# Patient Record
Sex: Male | Born: 1950 | Race: White | Hispanic: No | Marital: Married | Smoking: Former smoker
Health system: Southern US, Community
[De-identification: ages and names within clinical notes are randomized; demographics above are authoritative.]

## PROBLEM LIST (undated history)

## (undated) DIAGNOSIS — I10 Essential (primary) hypertension: Secondary | ICD-10-CM

## (undated) DIAGNOSIS — H9190 Unspecified hearing loss, unspecified ear: Secondary | ICD-10-CM

## (undated) DIAGNOSIS — T148XXA Other injury of unspecified body region, initial encounter: Secondary | ICD-10-CM

## (undated) DIAGNOSIS — M2010 Hallux valgus (acquired), unspecified foot: Secondary | ICD-10-CM

## (undated) DIAGNOSIS — G4730 Sleep apnea, unspecified: Secondary | ICD-10-CM

## (undated) DIAGNOSIS — M5430 Sciatica, unspecified side: Secondary | ICD-10-CM

## (undated) DIAGNOSIS — E039 Hypothyroidism, unspecified: Secondary | ICD-10-CM

## (undated) DIAGNOSIS — K219 Gastro-esophageal reflux disease without esophagitis: Secondary | ICD-10-CM

## (undated) HISTORY — DX: Gastro-esophageal reflux disease without esophagitis: K21.9

## (undated) HISTORY — DX: Other injury of unspecified body region, initial encounter: T14.8XXA

## (undated) HISTORY — PX: NECK SURGERY: SHX720

## (undated) HISTORY — DX: Sleep apnea, unspecified: G47.30

## (undated) HISTORY — PX: KNEE SURGERY: SHX244

## (undated) HISTORY — PX: BACK SURGERY: SHX140

## (undated) HISTORY — DX: Hallux valgus (acquired), unspecified foot: M20.10

## (undated) HISTORY — DX: Unspecified hearing loss, unspecified ear: H91.90

---

## 1999-12-15 ENCOUNTER — Inpatient Hospital Stay (HOSPITAL_COMMUNITY): Admission: EM | Admit: 1999-12-15 | Discharge: 1999-12-17 | Payer: Self-pay | Admitting: Emergency Medicine

## 1999-12-15 ENCOUNTER — Encounter: Payer: Self-pay | Admitting: Emergency Medicine

## 1999-12-16 ENCOUNTER — Encounter: Payer: Self-pay | Admitting: Internal Medicine

## 2000-04-14 ENCOUNTER — Emergency Department (HOSPITAL_COMMUNITY): Admission: EM | Admit: 2000-04-14 | Discharge: 2000-04-14 | Payer: Self-pay | Admitting: Emergency Medicine

## 2001-10-02 ENCOUNTER — Inpatient Hospital Stay (HOSPITAL_COMMUNITY): Admission: EM | Admit: 2001-10-02 | Discharge: 2001-10-04 | Payer: Self-pay | Admitting: Emergency Medicine

## 2002-01-27 ENCOUNTER — Ambulatory Visit (HOSPITAL_COMMUNITY): Admission: RE | Admit: 2002-01-27 | Discharge: 2002-01-27 | Payer: Self-pay | Admitting: Neurosurgery

## 2002-01-27 ENCOUNTER — Encounter: Payer: Self-pay | Admitting: Neurosurgery

## 2002-07-12 ENCOUNTER — Encounter: Payer: Self-pay | Admitting: Emergency Medicine

## 2002-07-12 ENCOUNTER — Emergency Department (HOSPITAL_COMMUNITY): Admission: EM | Admit: 2002-07-12 | Discharge: 2002-07-12 | Payer: Self-pay | Admitting: Emergency Medicine

## 2002-09-28 ENCOUNTER — Encounter: Payer: Self-pay | Admitting: Neurosurgery

## 2002-10-03 ENCOUNTER — Ambulatory Visit (HOSPITAL_COMMUNITY): Admission: RE | Admit: 2002-10-03 | Discharge: 2002-10-03 | Payer: Self-pay | Admitting: Neurosurgery

## 2002-10-03 ENCOUNTER — Encounter: Payer: Self-pay | Admitting: Neurosurgery

## 2003-04-29 ENCOUNTER — Inpatient Hospital Stay (HOSPITAL_COMMUNITY): Admission: AD | Admit: 2003-04-29 | Discharge: 2003-05-01 | Payer: Self-pay | Admitting: Neurosurgery

## 2003-04-29 ENCOUNTER — Encounter: Payer: Self-pay | Admitting: Neurosurgery

## 2003-05-01 ENCOUNTER — Encounter: Payer: Self-pay | Admitting: Neurosurgery

## 2003-10-03 ENCOUNTER — Ambulatory Visit (HOSPITAL_BASED_OUTPATIENT_CLINIC_OR_DEPARTMENT_OTHER): Admission: RE | Admit: 2003-10-03 | Discharge: 2003-10-03 | Payer: Self-pay | Admitting: Family Medicine

## 2004-11-20 ENCOUNTER — Ambulatory Visit: Payer: Self-pay | Admitting: Family Medicine

## 2004-12-28 ENCOUNTER — Ambulatory Visit: Payer: Self-pay | Admitting: Family Medicine

## 2004-12-29 ENCOUNTER — Ambulatory Visit (HOSPITAL_COMMUNITY): Admission: RE | Admit: 2004-12-29 | Discharge: 2004-12-29 | Payer: Self-pay | Admitting: Neurosurgery

## 2005-02-19 ENCOUNTER — Ambulatory Visit (HOSPITAL_COMMUNITY): Admission: RE | Admit: 2005-02-19 | Discharge: 2005-02-20 | Payer: Self-pay | Admitting: Neurosurgery

## 2005-07-13 ENCOUNTER — Ambulatory Visit: Payer: Self-pay | Admitting: Internal Medicine

## 2005-11-22 ENCOUNTER — Ambulatory Visit: Payer: Self-pay | Admitting: Internal Medicine

## 2005-11-30 ENCOUNTER — Ambulatory Visit: Payer: Self-pay | Admitting: Internal Medicine

## 2005-12-07 ENCOUNTER — Ambulatory Visit: Payer: Self-pay | Admitting: Internal Medicine

## 2006-03-21 ENCOUNTER — Ambulatory Visit (HOSPITAL_COMMUNITY): Admission: RE | Admit: 2006-03-21 | Discharge: 2006-03-21 | Payer: Self-pay | Admitting: Neurosurgery

## 2007-01-06 ENCOUNTER — Ambulatory Visit: Payer: Self-pay | Admitting: Internal Medicine

## 2007-01-06 LAB — CONVERTED CEMR LAB
AST: 37 units/L (ref 0–37)
BUN: 14 mg/dL (ref 6–23)
Chloride: 102 meq/L (ref 96–112)
HDL: 44 mg/dL (ref >39)
Hemoglobin: 14 g/dL (ref 13.0–17.0)
PSA: 0.45 ng/mL (ref 0.10–4.00)
Potassium: 3.9 meq/L (ref 3.5–5.3)
Triglycerides: 125 mg/dL (ref <150)
VLDL: 25 mg/dL (ref 0–40)

## 2007-01-12 ENCOUNTER — Encounter: Payer: Self-pay | Admitting: Internal Medicine

## 2007-01-12 LAB — CONVERTED CEMR LAB
FSH: 6.3 milliintl units/mL (ref 1.4–18.1)
LH: 3 milliintl units/mL (ref 1.5–9.3)
Testosterone: 368.19 ng/dL (ref 350–890)

## 2007-02-09 DIAGNOSIS — G4733 Obstructive sleep apnea (adult) (pediatric): Secondary | ICD-10-CM

## 2007-02-09 DIAGNOSIS — I10 Essential (primary) hypertension: Secondary | ICD-10-CM | POA: Insufficient documentation

## 2007-03-24 ENCOUNTER — Encounter: Payer: Self-pay | Admitting: Internal Medicine

## 2007-05-29 ENCOUNTER — Telehealth: Payer: Self-pay | Admitting: Internal Medicine

## 2007-06-20 ENCOUNTER — Telehealth (INDEPENDENT_AMBULATORY_CARE_PROVIDER_SITE_OTHER): Payer: Self-pay | Admitting: *Deleted

## 2007-08-07 ENCOUNTER — Encounter: Payer: Self-pay | Admitting: Internal Medicine

## 2007-08-29 ENCOUNTER — Encounter (INDEPENDENT_AMBULATORY_CARE_PROVIDER_SITE_OTHER): Payer: Self-pay | Admitting: *Deleted

## 2007-08-30 ENCOUNTER — Telehealth (INDEPENDENT_AMBULATORY_CARE_PROVIDER_SITE_OTHER): Payer: Self-pay | Admitting: *Deleted

## 2007-09-05 ENCOUNTER — Telehealth (INDEPENDENT_AMBULATORY_CARE_PROVIDER_SITE_OTHER): Payer: Self-pay | Admitting: *Deleted

## 2007-10-25 ENCOUNTER — Encounter: Payer: Self-pay | Admitting: Internal Medicine

## 2007-11-17 ENCOUNTER — Inpatient Hospital Stay (HOSPITAL_COMMUNITY): Admission: RE | Admit: 2007-11-17 | Discharge: 2007-11-20 | Payer: Self-pay | Admitting: Neurosurgery

## 2007-11-24 ENCOUNTER — Encounter: Payer: Self-pay | Admitting: Internal Medicine

## 2007-11-27 ENCOUNTER — Encounter: Payer: Self-pay | Admitting: Internal Medicine

## 2009-06-26 ENCOUNTER — Ambulatory Visit (HOSPITAL_BASED_OUTPATIENT_CLINIC_OR_DEPARTMENT_OTHER): Admission: RE | Admit: 2009-06-26 | Discharge: 2009-06-26 | Payer: Self-pay | Admitting: Orthopedic Surgery

## 2010-03-17 ENCOUNTER — Emergency Department (HOSPITAL_COMMUNITY): Admission: EM | Admit: 2010-03-17 | Discharge: 2010-03-17 | Payer: Self-pay | Admitting: Family Medicine

## 2010-05-20 ENCOUNTER — Encounter: Admission: RE | Admit: 2010-05-20 | Discharge: 2010-05-20 | Payer: Self-pay | Admitting: Neurosurgery

## 2010-10-04 ENCOUNTER — Encounter: Payer: Self-pay | Admitting: Neurosurgery

## 2010-12-17 LAB — POCT I-STAT, CHEM 8
Calcium, Ion: 1.18 mmol/L (ref 1.12–1.32)
HCT: 41 % (ref 39.0–52.0)
Sodium: 141 mEq/L (ref 135–145)

## 2011-01-26 NOTE — Discharge Summary (Signed)
Jeffrey Fitzpatrick, Jeffrey Fitzpatrick NO.:  [id number]   MEDICAL RECORD NO.:  [id number]          PATIENT TYPE:  INP   LOCATION:  3310                         FACILITY:  [facility]   PHYSICIAN:  Payton Doughty, M.D.      DATE OF BIRTH:  1951-01-08   DATE OF ADMISSION:  11/17/2007  DATE OF DISCHARGE:  11/20/2007                               DISCHARGE SUMMARY   ADMITTING DIAGNOSIS:  Displaced plate at W1-1, B1-4.   DISCHARGE DIAGNOSIS:  Displaced plate at N8-2, N5-6.   OPERATIVE PROCEDURE:  1. Removal of C5-6, C6-7 plate.  2. Evacuation of neck hematoma.   SERVICE:  Neurosurgery.   COMPLICATIONS:  Neck hematoma.   This is a 60 year old right-handed white gentleman whose History and  Physical is recounted on the chart.  He was fused several years ago, has  a solid fusion but has had increasing dysphagia, and his plate is back  and out a bit.  He is admitted for removal.   PAST MEDICAL HISTORY:  Benign.   PHYSICAL EXAMINATION:  GENERAL:  Intact.  NEUROLOGIC:  Intact.   HOSPITAL COURSE:  He was admitted after ascertaining normal laboratory  values.  He underwent removal of his hardware.  Dr. Lazarus Salines assisted.  Postoperatively, he was in the recovery room doing well, and some  swelling in the left side of his neck was noticed.  It was felt that he  was developing a hematoma.  He was taken urgently back to the OR for  evacuation.  It should be noted that in he first operation, a Al Pimple drain was placed.  In the second procedure, there was some clot  found, and a Hemovac drain was placed.  After the second operation, he  has had no further difficulties with accumulation of hematoma.  Intraoperatively at that time, there were found to be small bleeding  points on the longus coli.  Following cessation of drainage, the Hemovac  was removed on November 19, 2007.  The patient was watched in the ICU and  down in the step-down unit.  He was awake and alert, has no evidence of  neck  swelling.  Neurologically, he remains intact. Airway is good.  He  is being discharged home to the care of his family.  His followup will  be in the [facility] offices in about 5 days for suture removal.           ______________________________  Payton Doughty, M.D.     MWR/MEDQ  D:  11/20/2007  T:  11/20/2007  Job:  [id number]

## 2011-01-26 NOTE — H&P (Signed)
NAMESAMAN, UMSTEAD NO.:  [id number]   MEDICAL RECORD NO.:  [id number]          PATIENT TYPE:  INP   LOCATION:  2899                         FACILITY:  [facility]   PHYSICIAN:  Payton Doughty, M.D.      DATE OF BIRTH:  Dec 09, 1950   DATE OF ADMISSION:  11/17/2007  DATE OF DISCHARGE:                              HISTORY & PHYSICAL   ADMISSION DIAGNOSIS:  Back out plate at Z6-1, W9-6.   SERVICE:  Neurosurgery.   HISTORY:  This is a now 60 year old right-handed white gentleman who had  an anterior decompression and fusion done at C5-6 and C6-7 three years  ago.  He has got a good fusion solid but the plate his backed out and is  causing him some dysphagia so he is here for plate removal.   MEDICAL HISTORY:  Is benign.   SURGICAL HISTORY:  Is decompressive laminectomy in the past.   ALLERGIES:  None.   MEDICATIONS:  He is on antihypertensive medication.   FAMILY HISTORY:  Not given.   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   REVIEW OF SYSTEMS:  Remarkable for dysphagia.   PHYSICAL EXAMINATION:  HEENT:  Within normal limits.  He has a well-  healed incision in his neck with good range of motion.  CHEST:  Clear.  CARDIAC:  Regular rate and rhythm.  ABDOMEN:  Nontender with no hepatosplenomegaly.  EXTREMITIES:  Without clubbing or cyanosis.  GU:  Exam is deferred.  EXTREMITIES:  Peripheral pulses are good.  NEUROLOGICAL:  He is awake, alert and oriented.  Cranial nerves are  intact.  Motor exam shows 5/5 strength throughout the upper extremities.  Lower extremity strength is full.  Reflexes are intact.  Hoffman's is  negative.   Films show back out of the plate at E4-5 and C6-7.   CLINICAL IMPRESSION:  Back out of plate at W0-9 and C6-7.   PLAN:  For plate removal.  The risks and benefits have been discussed  with him especially potential injury of the esophagus and he wishes to  proceed.     ______________________________  Payton Doughty, M.D.     MWR/MEDQ  D:  11/17/2007  T:  11/18/2007  Job:  [id number]

## 2011-01-26 NOTE — Op Note (Signed)
[name], Jeffrey Fitzpatrick NO.:  [id number]   MEDICAL RECORD NO.:  [id number]          PATIENT TYPE:  INP   LOCATION:  2899                         FACILITY:  [facility]   PHYSICIAN:  Payton Doughty, M.D.      DATE OF BIRTH:  April 03, 1951   DATE OF PROCEDURE:  11/17/2007  DATE OF DISCHARGE:                               OPERATIVE REPORT   PREOPERATIVE DIAGNOSIS:  Protruding, displaced plate at O9-6, E9-5.   POSTOPERATIVE DIAGNOSIS:  Protruding, displaced plate at M8-4, X3-2.   OPERATIVE PROCEDURE:  Removal of anterior cervical C5-6 and C6-7 plate.   SURGEON:  Payton Doughty, MD.   SERVICE:  Neurosurgery.   ANESTHESIA:  General endotracheal.   PREP:  Sterile Betadine prep and scrub with alcohol wipe.   COMPLICATIONS:  None.   ASSISTANT:  Zola Button T. [facility], MD.   This is a 60 year old gentleman, who had a plate put in three years ago.  It has pushed forward a bit and he is having some dysphagia.  He was  taken to the operating room and smoothly anesthetized and intubated and  placed supine on the operating table in the halter head traction.  Following shave, prep, and drape in the usual sterile fashion, the old  skin incision was reopened, the sternocleidomastoid was identified, and  medial dissection revealed the carotid artery.  At this point, the  scarring was quite dense and so I asked for help from ENT surgeon, Dr.  Flo Shanks, who very graciously came in and assisted with the  dissection, which was carried lateral to the carotid artery and then  medially.  Underneath the esophagus and the scar complex, the plate was  visualized and carefully dissected free.  Four of the screws were  removed; two could not be removed and they were loosened and the plate  lifted free.  The wound was irrigated, Methylene Blue was instilled into  the proximal esophagus, and no leak was noted.  A Jackson-Pratt drain  was placed in the prevertebral space, and successive layers of #3-0  Vicryl and #4-0 Vicryl were used to close.  Benzoin and Steri-Strips  were placed and made occlusive with Telfa and OpSite, and the patient  returned to the recovery room in good condition.           ______________________________  Payton Doughty, M.D.     MWR/MEDQ  D:  11/17/2007  T:  11/17/2007  Job:  [id number]

## 2011-01-26 NOTE — Op Note (Signed)
NAMEEVERT, Jeffrey NO.:  [id number]   MEDICAL RECORD NO.:  [id number]          PATIENT TYPE:  INP   LOCATION:  3172                         FACILITY:  [facility]   PHYSICIAN:  Payton Doughty, M.D.      DATE OF BIRTH:  [birth year]   DATE OF PROCEDURE:  11/17/2007  DATE OF DISCHARGE:                               OPERATIVE REPORT   PREOPERATIVE DIAGNOSIS:  Neck hematoma.   POSTOPERATIVE DIAGNOSIS:  Neck hematoma.   OPERATIVE PROCEDURE:  Re-exploration of neck hematoma.   SURGEON:  Payton Doughty, M.D.   SERVICE:  Neurosurgery.   ANESTHESIA:  General endotracheal.   PREPARATION:  Prepped with sterile Betadine prep and scrubbed with  alcohol wipe.   COMPLICATIONS:  None.   NURSE ASSISTANT:  [place].   BODY OF TEXT:  This is a gentleman who several hours prior had had  removal of an anterior cervical plate, had been doing well and developed  some swelling on the left side of his neck and was taken back emergently  for evacuation of hematoma.  The patient was taken to the operating and  smoothly anesthetized with a Glide scope.  He was placed in the supine  position with the halter head traction and the old incision and  following shave, prepped and draped in the usual sterile fashion.  The  old skin incision was reopened.  Hematoma was evident; it was rapidly  evacuated.  Using the handheld retractors, the cavity was carefully  explored.  The only bleeding points that were found were on the longus  colli muscle; these were coagulated.  It was irrigated until clear and  observed for 15 minutes and no bleeding was evident.  A Hemovac drain  was placed, medium size.  It was connected to the drainage system and  secured.  Successive layers of 3-0 Vicryl and 4-0 nylon were used to  close.  Betadine and Telfa dressing were applied and the patient  returned to the recovery room in good condition.           ______________________________  Payton Doughty, M.D.     MWR/MEDQ  D:  11/17/2007  T:  11/20/2007  Job:  [id number]

## 2011-01-29 NOTE — Consult Note (Signed)
[place]. [facility]  Patient:    Jeffrey Fitzpatrick, Jeffrey Fitzpatrick Visit Number: [id number] MRN: [id number]          Service Type: MED Location: 3000 [id number] Attending Physician:  Emeterio Reeve Dictated by:   Payton Doughty, M.D. Proc. Date: 10/02/01 Admit Date:  10/02/2001                            Consultation Report  ADDENDUM  Mr. Grabel, while in the emergency room, had a syncopal episode and although his EKG was negative, he will be admitted as a precautionary measure for observation, therefore, the consult that I previously dictated should be addended to the admit note.  His orders will be written and he will be admitted to [facility]. Dictated by:   Payton Doughty, M.D. Attending Physician:  Emeterio Reeve DD:  10/02/01 TD:  10/03/01 Job: [id number]

## 2011-01-29 NOTE — Op Note (Signed)
NAME:  Jeffrey Fitzpatrick, Jeffrey Fitzpatrick                          ACCOUNT NO.:  [id number]   MEDICAL RECORD NO.:  [id number]                   PATIENT TYPE:  OIB   LOCATION:  2867                                 FACILITY:  [facility]   PHYSICIAN:  Kathaleen Maser. Pool, M.D.                 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   OPERATION PERFORMED:  Right L4-5 decompressive laminotomy with foraminotomy.   SURGEON:  Kathaleen Maser. Pool, M.D.   ASSISTANT:  Donalee Citrin, M.D.   ANESTHESIA:  General endotracheal.   INDICATIONS FOR PROCEDURE:  The patient is a 60 year old male with history  of back and right lower extremity pain, paresthesias and weakness consistent  with a right-sided L5 radiculopathy.  Work-up has demonstrated severe  multilevel stenosis, worse on the right at L4-5. We discussed options  available for management including the possibility of undergoing a right-  sided L4-5 decompressive laminotomy and foraminotomy for hopeful improvement  of symptoms.  The patient is aware of the risks and benefits and wishes to  proceed.   DESCRIPTION OF PROCEDURE:  The patient was taken to the operating room and  placed on the table in the supine position.  After adequate level of  anesthesia was achieved, the patient was positioned prone onto a Wilson  frame and appropriately padded.  The patient's lumbar region was prepped and  draped sterilely.  A 10 blade was used to make a linear skin incision  overlying the L4-5 interspace.  This was carried down sharply in the  midline.  A subperiosteal dissection was then performed on the right side  exposing the lamina and facet joints of L4 and L5.  Deep self-retaining  retractor was placed.  Intraoperative x-ray was taken, the level was  confirmed.   The laminotomy was then performed using high speed drill and  Kerrison rongeurs to remove the inferior edge of the lamina at L4, the  medial one third of the L4-5 facet joint and the superior remnant of the L5  lamina.  Ligamentum flavum was then elevated and resected in piecemeal  fashion using Kerrison rongeurs.  The underlying thecal sac and exiting L5  nerve root were identified.  A wide foraminotomy was then performed along  the course of the exiting L5 nerve root.  A blunt probe was passed easily  into the thecal sac and nerve roots.  There was no evidence of any further  compression.  There was no evidence of injury to thecal sac or nerve roots.  The microscope was brought into the field and used for the microdissection  of the right-sided L5 nerve root and underlying disk.  The disk was examined  and found not  to be significantly herniated.  It was decided not to do a  diskectomy.  The wound was then irrigated with antibiotic solution.  Gelfoam  was placed topically for hemostasis which was found to be good.  Microscope  and retractor system were removed.  Hemostasis in the muscle was achieved  with electrocautery.  The wound was then  closed in layers with Vicryl sutures.  Steri-Strips and sterile dressing  were applied.  There were no apparent complications.  The patient tolerated  the procedure well and returned to the recovery room postoperatively.                                                Henry A. Pool, M.D.    HAP/MEDQ  D:  10/03/2002  T:  10/03/2002  Job:  [id number]

## 2011-01-29 NOTE — Op Note (Signed)
NAMEELIZA, Jeffrey Fitzpatrick NO.:  [id number]   MEDICAL RECORD NO.:  [id number]          PATIENT TYPE:  OIB   LOCATION:  3017                         FACILITY:  [facility]   PHYSICIAN:  Payton Doughty, M.D.      DATE OF BIRTH:  08-18-1951   DATE OF PROCEDURE:  02/19/2005  DATE OF DISCHARGE:                                 OPERATIVE REPORT   PREOPERATIVE DIAGNOSIS:  Spondylosis at C5-6 and C6-7.   POSTOPERATIVE DIAGNOSES:  1.  Spondylosis at C5-6 and C6-7.  2.  Herniated disc at C6-7.   PROCEDURE:  C6-7, C5-6 anterior cervical decompression and fusion with a  Reflex hybrid plate.   SURGEON:  Payton Doughty, M.D.   SERVICE:  General Surgery.   ANESTHESIA:  General endotracheal.   PREPARATION:  Sterilely prepped and scrubbed with alcohol wipe.   COMPLICATIONS:  None.   BODY OF TEXT:  A 60 year old gentleman with spondylosis and right C6 and C7  radiculopathy.  He is taken to the operative room, smoothly anesthetized,  intubated, and placed supine on the operating table with the halter head  traction.  Following shave, prep, and drape in the usual sterile fashion,  the skin was incised in the midline at the medial border of the  sternocleidomastoid on the left side.  The platysma was identified,  elevated, divided and undermined.  The sternocleidomastoid was identified.  __________ dissection revealed the carotid artery retracted laterally on the  left, trachea and esophagus retracted laterally on the right, exposing the  bones of the anterior cervical spine.  Marker was placed.  Intraoperative x-  ray obtained to confirmed correctness of level.  Having confirmed  correctness of level, diskectomy was carried out at C5-6 and C6-7 under  gross observation.  The operating microscope was then brought in, and we  used microdissection technique to remove the remaining disc, remove the  remaining annulus, divide the posterior longitudinal ligament, and carefully  expose the  neural foramina bilaterally.  At C5-6, there was mostly  degenerative disease, with some osteophytes, particularly off to the right  side, and at C6-7 there was a fairly prominent herniated disc extending into  the right C7 neural foramen.  Complete removal of these effected  decompression of the nerve root.  7 mm bone grafts were fashioned with  patellar allograft and tapped into place at each level, and a 37 mm two-  level plate was placed with 12 mm screws, two in C5, two in C6, and two in  C7.  Intraoperative x-rays showed good placement of plate, screws, and bone  graft.  The wound was irrigated, and  successive layers of 3-0 Vicryl and 4-0 Vicryl were used to close the  incision.  Benzoin and Steri-Strips were placed and made occlusive with  Telfa and Op-Site.  Patient placed in an Aspen collar and returned to the  recovery room in god condition.       MWR/MEDQ  D:  02/19/2005  T:  02/20/2005  Job:  [id number]

## 2011-01-29 NOTE — Discharge Summary (Signed)
[place]. [facility]  Patient:    Jeffrey Fitzpatrick, Jeffrey Fitzpatrick                         MRN: [id number] Adm. Date:  [id number] Disc. Date: 12/17/99 Attending:  Pricilla Riffle Dictator:   Lavella Hammock, P.A. CC:         Dr. Shanon Rosser, [facility]             Dietrich Pates, M.D. [facility]                           Discharge Summary  DATE OF BIRTH:  [birth year]  PROCEDURES: 1. Stress Cardiolite. 2. Cardiac catheterization. 3. Coronary arteriogram. 4. Left ventriculogram.  HOSPITAL COURSE:  Mr. Tuzzolino is a 60 year old male with no known history of coronary artery disease, who came to the emergency room for chest pain on December 15, 1999.  He stated that he had been having chest pain continuously for seven days that waxed and waned but never completely went away.  He stated on the date of admission the pain increased in intensity to a 4/10 and ibuprofen was no help, so he came to the emergency room.  He received IV nitroglycerin and heparin and the pain decreased.  He was admitted to rule out MI and for further evaluation.  His enzymes were negative for MI.  Although a CK was consistently elevated, the MB was within normal limits.  He had a stress Cardiolite on December 16, 1999, where he achieved his target heart rate and stopped secondary to fatigue and mild chest pain.  The Cardiolite showed no marked abnormality, but there was a question of slight focal ischemia and the patient was scheduled for cardiac catheterization to further evaluate this. He had a cardiac catheterization done on December 17, 1999, which showed normal coronary arteries and normal left ventricular function.  He was stable after the catheterization and is to be discharged home on December 17, 1999 p.m. if he is ambulatory without difficulty and his groin is stable after ambulation.  LABORATORY DATA:  Hemoglobin 12.5, hematocrit 36.6, WBC 6.5, platelets 227. Sodium 142, potassium 3.8, chloride 103, CO2 36,  BUN 22, creatinine 1.0, glucose 89.  CONDITION ON DISCHARGE:  Stable.  CONSULTATIONS:  None.  COMPLICATIONS:  None.  DISCHARGE DIAGNOSES: 1. Chest pain, no coronary artery disease by catheterization.  We will add H2    blocker to patient medication and to follow up with primary doctor. 2. History of hypertension. 3. History of occasional tobacco use. 4. Family history of coronary artery disease.  DISCHARGE INSTRUCTIONS:  His activity level is to include no driving or strenuous strenuous activity for two days.  He is to stick to a low fat diet. He is to call the office for bleeding, swelling, or drainage at the catheterization site.  FOLLOW-UP:  He is to follow up with Dr. Shanon Rosser at [facility]. He is to follow up with Dr. Dietrich Pates p.r.n.  DISCHARGE MEDICATIONS: 1. Accupril 20 mg p.o. q.d. 2. HCTZ 25 mg p.o. q.d. 3. Ibuprofen p.r.n. 4. Protonix 40 mg p.o. q.d. DD:  12/17/99 TD:  12/17/99 Job: [id number]

## 2011-01-29 NOTE — Cardiovascular Report (Signed)
[place]. [facility]  Patient:    Jeffrey Fitzpatrick, Jeffrey Fitzpatrick                         MRN: [id number] Proc. Date: 12/17/99 Adm. Date:  [id number] Disc. Date: [id number] Attending:  Dietrich Pates V                        Cardiac Catheterization  REFERRING PHYSICIAN:  Dr. Shanon Rosser.  CARDIOLOGIST:  Dr. Jerral Bonito.  PROCEDURES PERFORMED: 1. Left heart catheterization. 2. Ventriculography.  DIAGNOSES: 1. No evidence of flow-limiting coronary artery disease. 2. Normal left ventricular systolic function.  HISTORY:  [name] is a 60 year old white male referred for diagnostic catheterization to access her coronary anatomy.  The patient had presented with substernal chest pain but had ruled out for myocardial infarction.  DESCRIPTION OF PROCEDURE:  After informed consent was obtained, the patient was brought to the catheterization laboratory.  The right groin was sterilely prepped and draped.  Lidocaine 1% was used to infiltrate the right groin and a 6 French arterial sheath was placed using the modified Seldinger technique. Subsequent 6 [place], JR4 catheters were used to engage the left and right coronary artery.  Selective coronary angiography was obtained in various projections.  After selective coronary angiography a 6 French pigtail catheter was advanced into the left ventricle and appropriate left-sided hemodynamics were obtained.  Subsequently, ventriculography was performed using power injections of contrast.  The pigtail catheter was then pulled back.  At the termination of the case the catheter and sheath were removed, and manual pressure applied until adequate hemostasis achieved.  The patient tolerated the procedure well and was transferred to the floor in stable condition.  FINDINGS:  HEMODYNAMICS:  Left ventricular pressure 150/22 mmHg, aortic pressure 150/88 mmHg.  SELECTIVE CORONARY ANGIOGRAPHY:  The left main coronary artery is a large vessel with no  flow-limiting coronary artery disease.  Left anterior descending artery demonstrates no evidence of flow-limiting coronary artery disease.  The left circumflex coronary artery is a free of flow-limiting disease.  Right coronary artery has no evidence of flow-limiting coronary artery disease.  Coronary circulation is a right dominant.  LEFT VENTRICULOGRAPHY:  Ventriculography was performed in a single plane RAO projection.  Ejection fraction was 60% with no wall motion abnormalities.  CONCLUSIONS: 1. No evidence for flow-limiting coronary artery disease. 2. Normal left ventricular systolic function.  RECOMMENDATIONS:  The patient will likely be discharged to home later today. There is no evidence of flow-limiting coronary artery disease.  Chest pain symptom is more likely to be musculoskeletal in nature.  Further followup will be provided by Dr. Myrtis Ser. DD:  02/19/00 TD:  02/23/00 Job: [id number]
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
## 2011-01-29 NOTE — Op Note (Signed)
NAME:  Jeffrey Fitzpatrick, SEARCY                          ACCOUNT NO.:  [id number]   MEDICAL RECORD NO.:  [id number]                   PATIENT TYPE:  INP   LOCATION:  3039                                 FACILITY:  [facility]   PHYSICIAN:  Kathaleen Maser. Pool, M.D.                 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   OPERATION PERFORMED:  Left L2-3 laminotomy and microdiskectomy.   SURGEON:  Kathaleen Maser. Pool, M.D.   ASSISTANT:  Donalee Citrin, M.D.   ANESTHESIA:  General endotracheal.   INDICATIONS FOR PROCEDURE:  Mr. Whisenant is a 60 year old male with history of  severe back and left lower extremity pain consistent with a left-sided L3  radiculopathy.  The patient has failed conservative management.  MRI  scanning demonstrates evidence of a large left-sided L2-3 disk herniation  with compression of the thecal sac and L3 nerve root.  The patient has  failed conservative management and presents now for surgery.   DESCRIPTION OF PROCEDURE:  The patient was taken to the operating room and  placed on the table in the supine position.  After adequate level of  anesthesia was achieved, the patient was positioned prone onto a Wilson  frame and appropriately padded.  The patient's lumbar region was prepped and  draped sterilely.  A 10 blade was used to make a linear skin incision  overlying the L2-3 interspace.  This was carried down sharply in the  midline.  A subperiosteal dissection was then performed on the left side  exposing the lamina and facet joints of L2 and L3.  Deep self-retaining  retractor was placed.  Intraoperative x-ray was taken, the level was  confirmed.  The laminotomy was then performed using high speed drill and  Kerrison rongeurs to remove the inferior aspect of the lamina at L2 lamina,  the medial aspect of the L2-3 facet joint and the superior rim of the L3  lamina.  The ligamentum flavum was then elevated and resected in piecemeal  fashion using Kerrison rongeurs.  The underlying thecal sac and exiting L3  nerve root were identified.   The microscope was brought into the field and used for microdissection of  the L2-3 disk space and overlying nerve root.  The epidural venous plexus  was coagulated and cut.  The thecal sac and nerve roots were retracted and  held in place at the midline.  Disk herniation was readily apparent.  Disk  dissected free using blunt nerve hooks and pituitary rongeurs.  Large  fragments of acute disk herniation were encountered.  The disk space was  then isolated and incised with a 15 blade in rectangular  fashion.  A wide  disk space cleanout was then achieved pituitary rongeurs and upward angled  pituitary rongeurs and Epstein curets.  All loose or obviously degenerative  disk material was removed from the interspace.  All elements of disk  herniation were completely resected.  The wound was then irrigated with  antibiotic solution.  Gelfoam was placed topically for hemostasis which was  found to be good.  Microscope and retractor system were removed.  Hemostasis  in the muscle was achieved with electrocautery.  The  wound was then closed in layers with Vicryl sutures.  Steri-Strips and  sterile dressing were applied.  The patient tolerated the procedure well  without any complications and returned to the recovery room in good  condition.                                                Henry A. Pool, M.D.    HAP/MEDQ  D:  05/22/2003  T:  05/23/2003  Job:  [id number]

## 2011-01-29 NOTE — H&P (Signed)
Jeffrey Fitzpatrick, HAMAD NO.:  [id number]   MEDICAL RECORD NO.:  [id number]          PATIENT TYPE:  OIB   LOCATION:  3017                         FACILITY:  [facility]   PHYSICIAN:  Payton Doughty, M.D.      DATE OF BIRTH:  [birth year]   DATE OF ADMISSION:  02/19/2005  DATE OF DISCHARGE:                                HISTORY & PHYSICAL   ADMITTING DIAGNOSIS:  Spondylosis C5-C6 and C6-C7.   SERVICE:  Neurosurgery.   __________  A 60 year old, right handed, white gentleman who a number of  years ago I saw for consultation for a C1 fracture.  He did well with that.  I have seen him off and on over the years.  About a week ago, he called with  increasing pain down his neck and down his right arm.  MR showed a disk at  C5-C6.  He was tried in traction and epidural steroids which have not helped  him.  He is now admitted for an anterior decompression and fusion.   MEDICAL HISTORY:  Benign.   SURGICAL HISTORY:  Decompressive laminectomy in the past.   ALLERGIES:  None.   MEDICATIONS:  Antihypertensives.   PHYSICAL EXAMINATION:  HEENT:  Within normal limits.  NECK:  He has reasonable range of motion of his neck.  CHEST:  Clear.  CARDIAC:  Regular rate and rhythm.  ABDOMEN:  Nontender.  No hepatosplenomegaly.  EXTREMITIES:  Without clubbing, cyanosis.  Peripheral pulses are good.  GU:  Deferred.  NEUROLOGIC:  He is awake, alert, and oriented.  His cranial nerves are  intact.  Motor exam shows 5/5 strength throughout the upper extremities,  save for the right triceps which is 5-/5.  He has a C6 and C7 sensory  deficit on the right side.  Reflexes are absent on the right arm, 1 on the  left.  Lower extremities are __________  .   MR results show spondylosis at C5-C6 and C6-C7.   PLAN:  Anterior cervical decompression/fusion C5-C6, C6-C7.  The risks and  benefits of this approach have been discussed with him and he wishes to  proceed.      MWR/MEDQ  D:  02/19/2005  T:   02/19/2005  Job:  [id number]

## 2011-06-04 LAB — DIFFERENTIAL
Basophils Absolute: 0
Basophils Relative: 0
Monocytes Absolute: 0.7
Monocytes Relative: 9
Neutro Abs: 4.8

## 2011-06-04 LAB — CBC
Hemoglobin: 14
MCV: 87.4
RBC: 4.8

## 2011-06-04 LAB — COMPREHENSIVE METABOLIC PANEL
AST: 37
Chloride: 104
GFR calc non Af Amer: >60
Glucose, Bld: 111 — ABNORMAL HIGH
Potassium: 3.8
Total Bilirubin: 0.6
Total Protein: 7.3

## 2011-06-04 LAB — URINALYSIS, ROUTINE W REFLEX MICROSCOPIC
Glucose, UA: NEGATIVE
Hgb urine dipstick: NEGATIVE
Specific Gravity, Urine: 1.019
Urobilinogen, UA: 0.2
pH: 5

## 2011-06-04 LAB — PROTIME-INR
INR: 1
Prothrombin Time: 13.2

## 2011-06-17 ENCOUNTER — Other Ambulatory Visit: Payer: Self-pay | Admitting: Neurosurgery

## 2011-06-17 DIAGNOSIS — M549 Dorsalgia, unspecified: Secondary | ICD-10-CM

## 2011-06-17 DIAGNOSIS — M545 Low back pain: Secondary | ICD-10-CM

## 2011-06-17 DIAGNOSIS — R200 Anesthesia of skin: Secondary | ICD-10-CM

## 2011-06-23 ENCOUNTER — Ambulatory Visit
Admission: RE | Admit: 2011-06-23 | Discharge: 2011-06-23 | Disposition: A | Discharge status: Home / Self Care | Payer: Medicare Other | Source: Ambulatory Visit | Attending: Neurosurgery | Admitting: Neurosurgery

## 2011-06-23 DIAGNOSIS — M545 Low back pain: Secondary | ICD-10-CM

## 2011-06-23 DIAGNOSIS — R200 Anesthesia of skin: Secondary | ICD-10-CM

## 2011-06-23 DIAGNOSIS — M549 Dorsalgia, unspecified: Secondary | ICD-10-CM

## 2011-07-16 ENCOUNTER — Encounter (HOSPITAL_COMMUNITY): Payer: Self-pay | Admitting: Pharmacy Technician

## 2011-07-19 ENCOUNTER — Other Ambulatory Visit: Payer: Self-pay

## 2011-07-19 ENCOUNTER — Other Ambulatory Visit (HOSPITAL_COMMUNITY): Payer: Self-pay | Admitting: Neurosurgery

## 2011-07-19 ENCOUNTER — Ambulatory Visit (HOSPITAL_COMMUNITY): Admission: RE | Admit: 2011-07-19 | Payer: Medicare Other | Source: Ambulatory Visit

## 2011-07-19 ENCOUNTER — Encounter (HOSPITAL_COMMUNITY)
Admission: RE | Admit: 2011-07-19 | Discharge: 2011-07-19 | Disposition: A | Discharge status: Home / Self Care | Payer: Medicare Other | Source: Ambulatory Visit | Attending: Neurosurgery | Admitting: Neurosurgery

## 2011-07-19 ENCOUNTER — Encounter (HOSPITAL_COMMUNITY): Payer: Self-pay

## 2011-07-19 ENCOUNTER — Ambulatory Visit (HOSPITAL_COMMUNITY)
Admission: RE | Admit: 2011-07-19 | Discharge: 2011-07-19 | Disposition: A | Discharge status: Home / Self Care | Payer: Medicare Other | Source: Ambulatory Visit | Attending: Neurosurgery | Admitting: Neurosurgery

## 2011-07-19 DIAGNOSIS — Z01811 Encounter for preprocedural respiratory examination: Secondary | ICD-10-CM

## 2011-07-19 DIAGNOSIS — Z01812 Encounter for preprocedural laboratory examination: Secondary | ICD-10-CM | POA: Insufficient documentation

## 2011-07-19 DIAGNOSIS — Z01818 Encounter for other preprocedural examination: Secondary | ICD-10-CM | POA: Insufficient documentation

## 2011-07-19 DIAGNOSIS — M48061 Spinal stenosis, lumbar region without neurogenic claudication: Secondary | ICD-10-CM | POA: Insufficient documentation

## 2011-07-19 DIAGNOSIS — Z01810 Encounter for preprocedural cardiovascular examination: Secondary | ICD-10-CM | POA: Insufficient documentation

## 2011-07-19 HISTORY — DX: Essential (primary) hypertension: I10

## 2011-07-19 LAB — DIFFERENTIAL
Basophils Relative: 1 % (ref 0–1)
Eosinophils Absolute: 0.1 10*3/uL (ref 0.0–0.7)
Eosinophils Relative: 2 % (ref 0–5)
Lymphs Abs: 2.3 10*3/uL (ref 0.7–4.0)
Monocytes Relative: 9 % (ref 3–12)

## 2011-07-19 LAB — BASIC METABOLIC PANEL
BUN: 13 mg/dL (ref 6–23)
CO2: 29 mEq/L (ref 19–32)
Chloride: 104 mEq/L (ref 96–112)
Glucose, Bld: 90 mg/dL (ref 70–99)
Potassium: 4.2 mEq/L (ref 3.5–5.1)

## 2011-07-19 LAB — CBC
Hemoglobin: 13.6 g/dL (ref 13.0–17.0)
MCH: 28.8 pg (ref 26.0–34.0)
MCHC: 33.8 g/dL (ref 30.0–36.0)
MCV: 85 fL (ref 78.0–100.0)
RBC: 4.73 MIL/uL (ref 4.22–5.81)

## 2011-07-19 LAB — ABO/RH: ABO/RH(D): B POS

## 2011-07-19 NOTE — Pre-Procedure Instructions (Signed)
20 Jeffrey Fitzpatrick  07/19/2011   Your procedure is scheduled on:  07/27/11  Report to [facility] at 530 AM.  Call this number if you have problems the morning of surgery: [phone]   Remember:   Do not eat food:After Midnight.  Do not drink clear liquids: 4 Hours before arrival.  Take these medicines the morning of surgery with A SIP OF WATER: NORVAC,SYNTHROID,DIOVAN   Do not wear jewelry, make-up or nail polish.  Do not wear lotions, powders, or perfumes. You may wear deodorant.  Do not shave 48 hours prior to surgery.  Do not bring valuables to the hospital.  Contacts, dentures or bridgework may not be worn into surgery.  Leave suitcase in the car. After surgery it may be brought to your room.  For patients admitted to the hospital, checkout time is 11:00 AM the day of discharge.   Patients discharged the day of surgery will not be allowed to drive home.  Name and phone number of your driver: FAMILY  Special Instructions: CHG Shower Use Special Wash: 1/2 bottle night before surgery and 1/2 bottle morning of surgery.   Please read over the following fact sheets that you were given: MRSA Information and Surgical Site Infection Prevention

## 2011-07-23 NOTE — Consult Note (Signed)
Anesthesia:  Patient is a 60 year old for lumbar decompression/lami on 07/27/11.  Hx of HTN, former smoker.  Asked to review abnormal EKG showing inferolateral T wave inversion.  He had a similar pre-operative EKG on 11/10/07 with interpreting Cardiologist feeling that it had not significantly changed since 05/01/03.  In 2001 he did have an abnormal stress test followed by a cardiac cath (by Dr. Dietrich Pates) on 12/17/99 showing no evidence of flow limiting CAD and normal LV systolic function.  I reviewed his recent EKG with Dr. Ivin Booty who agrees that since his EKG is stable, would anticipate that he can proceed if remains asymptomatic.

## 2011-07-26 MED ORDER — DEXAMETHASONE SODIUM PHOSPHATE 10 MG/ML IJ SOLN
10.0000 mg | Freq: Once | INTRAMUSCULAR | Status: AC
  Administered 2011-07-27: 10 mg via INTRAVENOUS

## 2011-07-26 MED ORDER — CEFAZOLIN SODIUM 1-5 GM-% IV SOLN
1.0000 g | INTRAVENOUS | Status: DC
  Filled 2011-07-26: qty 50

## 2011-07-27 ENCOUNTER — Encounter (HOSPITAL_COMMUNITY): Payer: Self-pay | Admitting: *Deleted

## 2011-07-27 ENCOUNTER — Encounter (HOSPITAL_COMMUNITY)
Admission: RE | Disposition: A | Discharge status: Home / Self Care | Payer: Self-pay | Source: Ambulatory Visit | Attending: Neurosurgery

## 2011-07-27 ENCOUNTER — Inpatient Hospital Stay (HOSPITAL_COMMUNITY): Payer: Medicare Other

## 2011-07-27 ENCOUNTER — Inpatient Hospital Stay (HOSPITAL_COMMUNITY): Payer: Medicare Other | Admitting: Vascular Surgery

## 2011-07-27 ENCOUNTER — Encounter (HOSPITAL_COMMUNITY): Payer: Self-pay | Admitting: Vascular Surgery

## 2011-07-27 ENCOUNTER — Ambulatory Visit (HOSPITAL_COMMUNITY)
Admission: RE | Admit: 2011-07-27 | Discharge: 2011-07-27 | Disposition: A | Discharge status: Home / Self Care | Payer: Medicare Other | Source: Ambulatory Visit | Attending: Neurosurgery | Admitting: Neurosurgery

## 2011-07-27 DIAGNOSIS — M48062 Spinal stenosis, lumbar region with neurogenic claudication: Principal | ICD-10-CM | POA: Insufficient documentation

## 2011-07-27 DIAGNOSIS — I10 Essential (primary) hypertension: Secondary | ICD-10-CM | POA: Insufficient documentation

## 2011-07-27 DIAGNOSIS — M48061 Spinal stenosis, lumbar region without neurogenic claudication: Secondary | ICD-10-CM | POA: Diagnosis present

## 2011-07-27 DIAGNOSIS — G4730 Sleep apnea, unspecified: Secondary | ICD-10-CM | POA: Insufficient documentation

## 2011-07-27 HISTORY — PX: LUMBAR LAMINECTOMY/DECOMPRESSION MICRODISCECTOMY: SHX5026

## 2011-07-27 SURGERY — LUMBAR LAMINECTOMY/DECOMPRESSION MICRODISCECTOMY
Anesthesia: General | Wound class: Clean

## 2011-07-27 MED ORDER — ONDANSETRON HCL 4 MG/2ML IJ SOLN
INTRAMUSCULAR | Status: DC | PRN
  Administered 2011-07-27: 4 mg via INTRAVENOUS

## 2011-07-27 MED ORDER — DOCUSATE SODIUM 100 MG PO CAPS: 100.0000 mg | ORAL_CAPSULE | Freq: Two times a day (BID) | ORAL | Status: DC

## 2011-07-27 MED ORDER — LACTATED RINGERS IV SOLN
INTRAVENOUS | Status: DC | PRN
  Administered 2011-07-27 (×3): via INTRAVENOUS

## 2011-07-27 MED ORDER — ROCURONIUM BROMIDE 100 MG/10ML IV SOLN
INTRAVENOUS | Status: DC | PRN
  Administered 2011-07-27: 50 mg via INTRAVENOUS
  Administered 2011-07-27: 30 mg via INTRAVENOUS
  Administered 2011-07-27: 10 mg via INTRAVENOUS

## 2011-07-27 MED ORDER — CYCLOBENZAPRINE HCL 10 MG PO TABS: 10.0000 mg | ORAL_TABLET | Freq: Three times a day (TID) | ORAL | Status: AC | PRN

## 2011-07-27 MED ORDER — HYDROCODONE-ACETAMINOPHEN 5-325 MG PO TABS
1.0000 | ORAL_TABLET | ORAL | Status: DC | PRN
  Administered 2011-07-27: 2 via ORAL
  Filled 2011-07-27: qty 2

## 2011-07-27 MED ORDER — SODIUM CHLORIDE 0.9 % IV SOLN
250.0000 mL | INTRAVENOUS | Status: DC
  Administered 2011-07-27: 250 mL via INTRAVENOUS

## 2011-07-27 MED ORDER — HYDROMORPHONE HCL PF 1 MG/ML IJ SOLN
INTRAMUSCULAR | Status: AC
  Filled 2011-07-27: qty 1

## 2011-07-27 MED ORDER — ASPIRIN EC 81 MG PO TBEC
81.0000 mg | DELAYED_RELEASE_TABLET | Freq: Every day | ORAL | Status: DC
  Administered 2011-07-27: 81 mg via ORAL
  Filled 2011-07-27: qty 1

## 2011-07-27 MED ORDER — CEFAZOLIN SODIUM 1-5 GM-% IV SOLN
INTRAVENOUS | Status: DC | PRN
  Administered 2011-07-27: 1 g via INTRAVENOUS

## 2011-07-27 MED ORDER — OLMESARTAN 10 MG HALF TABLET
10.0000 mg | ORAL_TABLET | Freq: Every day | ORAL | Status: DC
  Filled 2011-07-27: qty 1

## 2011-07-27 MED ORDER — NEOSTIGMINE METHYLSULFATE 1 MG/ML IJ SOLN
INTRAMUSCULAR | Status: DC | PRN
  Administered 2011-07-27: 5 mg via INTRAVENOUS

## 2011-07-27 MED ORDER — HYDROMORPHONE HCL PF 1 MG/ML IJ SOLN: 0.2500 mg | INTRAMUSCULAR | Status: DC | PRN

## 2011-07-27 MED ORDER — LEVOTHYROXINE SODIUM 100 MCG PO TABS
100.0000 ug | ORAL_TABLET | Freq: Every day | ORAL | Status: DC
  Filled 2011-07-27: qty 1

## 2011-07-27 MED ORDER — MENTHOL 3 MG MT LOZG: 1.0000 | LOZENGE | OROMUCOSAL | Status: DC | PRN

## 2011-07-27 MED ORDER — BUPIVACAINE HCL (PF) 0.25 % IJ SOLN
INTRAMUSCULAR | Status: DC | PRN
  Administered 2011-07-27: 20 mL

## 2011-07-27 MED ORDER — CYCLOBENZAPRINE HCL 10 MG PO TABS: 10.0000 mg | ORAL_TABLET | Freq: Three times a day (TID) | ORAL | Status: DC | PRN

## 2011-07-27 MED ORDER — SODIUM CHLORIDE 0.9 % IJ SOLN
3.0000 mL | Freq: Two times a day (BID) | INTRAMUSCULAR | Status: DC
  Administered 2011-07-27: 3 mL via INTRAVENOUS

## 2011-07-27 MED ORDER — SODIUM CHLORIDE 0.9 % IJ SOLN: 3.0000 mL | INTRAMUSCULAR | Status: DC | PRN

## 2011-07-27 MED ORDER — GLYCOPYRROLATE 0.2 MG/ML IJ SOLN
INTRAMUSCULAR | Status: DC | PRN
  Administered 2011-07-27: .8 mg via INTRAVENOUS
  Administered 2011-07-27: 0.2 mg via INTRAVENOUS

## 2011-07-27 MED ORDER — HYDROCODONE-ACETAMINOPHEN 5-325 MG PO TABS: 1.0000 | ORAL_TABLET | ORAL | Status: AC | PRN

## 2011-07-27 MED ORDER — THROMBIN 5000 UNITS EX KIT
PACK | CUTANEOUS | Status: DC | PRN
  Administered 2011-07-27: 5000 [IU] via TOPICAL

## 2011-07-27 MED ORDER — THERA M PLUS PO TABS
1.0000 | ORAL_TABLET | Freq: Every day | ORAL | Status: DC
  Filled 2011-07-27: qty 1

## 2011-07-27 MED ORDER — ONDANSETRON HCL 4 MG/2ML IJ SOLN: 4.0000 mg | Freq: Once | INTRAMUSCULAR | Status: DC | PRN

## 2011-07-27 MED ORDER — HYDROMORPHONE HCL PF 1 MG/ML IJ SOLN
0.5000 mg | INTRAMUSCULAR | Status: DC | PRN
  Administered 2011-07-27: 13:00:00 via INTRAVENOUS

## 2011-07-27 MED ORDER — SUFENTANIL CITRATE 50 MCG/ML IV SOLN
INTRAVENOUS | Status: DC | PRN
  Administered 2011-07-27: 10 ug via INTRAVENOUS
  Administered 2011-07-27: 20 ug via INTRAVENOUS

## 2011-07-27 MED ORDER — MUPIROCIN 2 % EX OINT
TOPICAL_OINTMENT | Freq: Once | CUTANEOUS | Status: DC
  Filled 2011-07-27: qty 22

## 2011-07-27 MED ORDER — ACETAMINOPHEN 325 MG PO TABS: 650.0000 mg | ORAL_TABLET | ORAL | Status: DC | PRN

## 2011-07-27 MED ORDER — PROPOFOL 10 MG/ML IV EMUL
INTRAVENOUS | Status: DC | PRN
  Administered 2011-07-27: 300 mg via INTRAVENOUS

## 2011-07-27 MED ORDER — PHENOL 1.4 % MT LIQD: 1.0000 | OROMUCOSAL | Status: DC | PRN

## 2011-07-27 MED ORDER — SODIUM CHLORIDE 0.9 % IR SOLN
Status: DC | PRN
  Administered 2011-07-27: 08:00:00

## 2011-07-27 MED ORDER — ACETAMINOPHEN 650 MG RE SUPP: 650.0000 mg | RECTAL | Status: DC | PRN

## 2011-07-27 MED ORDER — ZOLPIDEM TARTRATE 5 MG PO TABS: 10.0000 mg | ORAL_TABLET | Freq: Every evening | ORAL | Status: DC | PRN

## 2011-07-27 MED ORDER — OXYCODONE-ACETAMINOPHEN 5-325 MG PO TABS: 1.0000 | ORAL_TABLET | ORAL | Status: DC | PRN

## 2011-07-27 MED ORDER — ONDANSETRON HCL 4 MG/2ML IJ SOLN: 4.0000 mg | INTRAMUSCULAR | Status: DC | PRN

## 2011-07-27 MED ORDER — AMLODIPINE BESYLATE 5 MG PO TABS
5.0000 mg | ORAL_TABLET | Freq: Every day | ORAL | Status: DC
  Filled 2011-07-27: qty 1

## 2011-07-27 MED ORDER — SODIUM CHLORIDE 0.9 % IR SOLN
Status: DC | PRN
  Administered 2011-07-27: 1

## 2011-07-27 MED ORDER — EPHEDRINE SULFATE 50 MG/ML IJ SOLN
INTRAMUSCULAR | Status: DC | PRN
  Administered 2011-07-27 (×3): 10 mg via INTRAVENOUS

## 2011-07-27 MED ORDER — ONE-DAILY MULTI VITAMINS PO TABS: 1.0000 | ORAL_TABLET | Freq: Every day | ORAL | Status: DC

## 2011-07-27 MED ORDER — LACTATED RINGERS IV SOLN
INTRAVENOUS | Status: DC
  Administered 2011-07-27: 13:00:00 via INTRAVENOUS

## 2011-07-27 MED ORDER — CEFAZOLIN SODIUM 1-5 GM-% IV SOLN
1.0000 g | Freq: Three times a day (TID) | INTRAVENOUS | Status: DC
  Administered 2011-07-27: 1 g via INTRAVENOUS
  Filled 2011-07-27 (×2): qty 50

## 2011-07-27 SURGICAL SUPPLY — 51 items
ADH SKN CLS APL DERMABOND .7 (GAUZE/BANDAGES/DRESSINGS) ×1
APL SKNCLS STERI-STRIP NONHPOA (GAUZE/BANDAGES/DRESSINGS)
BAG DECANTER FOR FLEXI CONT (MISCELLANEOUS) ×2 IMPLANT
BENZOIN TINCTURE PRP APPL 2/3 (GAUZE/BANDAGES/DRESSINGS) ×1 IMPLANT
BLADE SURG ROTATE 9660 (MISCELLANEOUS) IMPLANT
BRUSH SCRUB EZ PLAIN DRY (MISCELLANEOUS) ×2 IMPLANT
BUR CUTTER 7.0 ROUND (BURR) ×2 IMPLANT
CANISTER SUCTION 2500CC (MISCELLANEOUS) ×2 IMPLANT
CLOTH BEACON ORANGE TIMEOUT ST (SAFETY) ×2 IMPLANT
CONT SPEC 4OZ CLIKSEAL STRL BL (MISCELLANEOUS) ×2 IMPLANT
DECANTER SPIKE VIAL GLASS SM (MISCELLANEOUS) ×2 IMPLANT
DERMABOND ADVANCED (GAUZE/BANDAGES/DRESSINGS) ×1
DERMABOND ADVANCED .7 DNX12 (GAUZE/BANDAGES/DRESSINGS) ×1 IMPLANT
DRAPE INCISE 23X17 IOBAN STRL (DRAPES) ×1
DRAPE INCISE 23X17 STRL (DRAPES) IMPLANT
DRAPE INCISE IOBAN 23X17 STRL (DRAPES) ×1 IMPLANT
DRAPE LAPAROTOMY 100X72X124 (DRAPES) ×2 IMPLANT
DRAPE MICROSCOPE ZEISS OPMI (DRAPES) ×2 IMPLANT
DRAPE POUCH INSTRU U-SHP 10X18 (DRAPES) ×2 IMPLANT
DRAPE PROXIMA HALF (DRAPES) IMPLANT
DRAPE SURG 17X23 STRL (DRAPES) ×4 IMPLANT
ELECT REM PT RETURN 9FT ADLT (ELECTROSURGICAL) ×2
ELECTRODE REM PT RTRN 9FT ADLT (ELECTROSURGICAL) ×1 IMPLANT
EVACUATOR 1/8 PVC DRAIN (DRAIN) ×1 IMPLANT
GAUZE SPONGE 4X4 16PLY XRAY LF (GAUZE/BANDAGES/DRESSINGS) IMPLANT
GLOVE ECLIPSE 8.5 STRL (GLOVE) ×2 IMPLANT
GLOVE EXAM NITRILE LRG STRL (GLOVE) IMPLANT
GLOVE EXAM NITRILE MD LF STRL (GLOVE) ×1 IMPLANT
GLOVE EXAM NITRILE XL STR (GLOVE) IMPLANT
GLOVE EXAM NITRILE XS STR PU (GLOVE) IMPLANT
GOWN BRE IMP SLV AUR LG STRL (GOWN DISPOSABLE) IMPLANT
GOWN BRE IMP SLV AUR XL STRL (GOWN DISPOSABLE) ×2 IMPLANT
GOWN STRL REIN 2XL LVL4 (GOWN DISPOSABLE) IMPLANT
KIT BASIN OR (CUSTOM PROCEDURE TRAY) ×2 IMPLANT
KIT ROOM TURNOVER OR (KITS) ×2 IMPLANT
NDL SPNL 22GX3.5 QUINCKE BK (NEEDLE) ×1 IMPLANT
NEEDLE HYPO 22GX1.5 SAFETY (NEEDLE) ×2 IMPLANT
NEEDLE SPNL 22GX3.5 QUINCKE BK (NEEDLE) ×2 IMPLANT
NS IRRIG 1000ML POUR BTL (IV SOLUTION) ×2 IMPLANT
PACK LAMINECTOMY NEURO (CUSTOM PROCEDURE TRAY) ×2 IMPLANT
RUBBERBAND STERILE (MISCELLANEOUS) ×4 IMPLANT
SPONGE GAUZE 4X4 12PLY (GAUZE/BANDAGES/DRESSINGS) ×2 IMPLANT
SPONGE SURGIFOAM ABS GEL SZ50 (HEMOSTASIS) ×2 IMPLANT
STRIP CLOSURE SKIN 1/2X4 (GAUZE/BANDAGES/DRESSINGS) ×2 IMPLANT
SUT VIC AB 2-0 CT1 18 (SUTURE) ×3 IMPLANT
SUT VIC AB 3-0 SH 8-18 (SUTURE) ×3 IMPLANT
SYR 20ML ECCENTRIC (SYRINGE) ×2 IMPLANT
TAPE CLOTH SURG 4X10 WHT LF (GAUZE/BANDAGES/DRESSINGS) ×1 IMPLANT
TOWEL OR 17X24 6PK STRL BLUE (TOWEL DISPOSABLE) ×2 IMPLANT
TOWEL OR 17X26 10 PK STRL BLUE (TOWEL DISPOSABLE) ×2 IMPLANT
WATER STERILE IRR 1000ML POUR (IV SOLUTION) ×2 IMPLANT

## 2011-07-27 NOTE — Progress Notes (Signed)
Dr Katrinka Blazing aware of sbp180's. Ok to tx to floor. No new orders.

## 2011-07-27 NOTE — Op Note (Signed)
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  Surgeon:Collen Vincent A.Cathlyn Tersigni, M.D.  Asst. Surgeon: Marikay Alar  Anesthesia: General  Indication: The patient is a 60 year old male with history of lower extremity pain paresthesias and numbness consistent with neurogenic claudication which has failed conservative management her workup demonstrates evidence of marked stenosis at L2-3 and L3-4.  Operative note: The patient is taken to the operating room and placed under general anesthesia. The patient is turned prone onto a Wilson frame and appropriately padded. Patient's lumbar region is prepped and draped sterilely. 10 blade is used to make a linear skin incision overlying the L2-3 to 4 levels. This is carried down sharply to the in the midline. A subperiosteal dissection performed exposing the lamina and facet joints of L2-L3 and L4. Deep self retaining retractor was placed intraoperative x-rays taken and the levels were confirmed. Decompressive laminectomy was then performed using [place] rongeurs care center in the high-speed drill to remove the entire lamina of L2 and lamina of L3 and the superior aspect lamina of L4. Ligamentum flavum was elevated and resect usual fashion using Kerrison rongeurs the lateral gutters were undercut using Kerrison rongeurs. Decompressive foraminotomies and then perform on a course exiting L2 and L3 and L4 nerve roots bilaterally. At this point a very thorough decompression achieved. There was no injury to the thecal sac or nerve roots. Wound was then irrigated out solution. Gelfoam was placed over hemostasis activated. A medium Hemovac drain was left upper space. The wounds and closed in layers of Vicryl sutures. Steri-Strips and  sterile dressing were applied. DuraPrep condition. Patient tolerated the procedure well and returned to the recovery room postop.

## 2011-07-27 NOTE — Discharge Summary (Signed)
Physician Discharge Summary  Patient ID: TREGAN READ MRN: [id number] DOB/AGE: [birth year] 60 y.o.  Admit date: 07/27/2011 Discharge date: 07/27/2011  Admission Diagnoses:  Discharge Diagnoses:  Active Problems:  * No active hospital problems. *    Discharged Condition: good  Hospital Course: Patient was admitted the hospital underwent an uncomplicated lumbar decompression postoperatively is doing well. No back or or lower chimney pain. Strength station intact. Wound healing well. Plan discharge home  Consults: none  Significant Diagnostic Studies: None  Treatments: surgery: L2-3 L3-4 decompressive laminectomy  Discharge Exam: Blood pressure 137/77, pulse 78, temperature 97.5 F (36.4 C), temperature source Oral, resp. rate 20, SpO2 91.00%. Awake alert oriented and appropriate cranial nerve function intact motor and sensory function extremities normal wound healing well.  Disposition:   Discharge Orders    Future Orders Please Complete By Expires   Discontinue JP/Blake drain        Current Discharge Medication List    START taking these medications   Details  cyclobenzaprine (FLEXERIL) 10 MG tablet Take 1 tablet (10 mg total) by mouth 3 (three) times daily as needed for muscle spasms. Qty: 30 tablet, Refills: 1    HYDROcodone-acetaminophen (NORCO) 5-325 MG per tablet Take 1-2 tablets by mouth every 4 (four) hours as needed. Qty: 60 tablet, Refills: 1      CONTINUE these medications which have NOT CHANGED   Details  amLODipine (NORVASC) 5 MG tablet Take 5 mg by mouth daily.      aspirin EC 81 MG tablet Take 81 mg by mouth daily.      levothyroxine (SYNTHROID, LEVOTHROID) 100 MCG tablet Take 100 mcg by mouth daily.      Multiple Vitamin (MULTIVITAMIN) tablet Take 1 tablet by mouth daily.      valsartan (DIOVAN) 80 MG tablet Take 80 mg by mouth daily.         Follow-up Information    Make an appointment with Tillman Kazmierski A.   Contact information:   [address] [place] [address] [phone]          Signed: Temple Pacini 07/27/2011, 5:33 PM

## 2011-07-27 NOTE — Anesthesia Procedure Notes (Signed)
Procedure Name: Intubation Date/Time: 07/27/2011 7:49 AM Performed by: Darcey Nora Pre-anesthesia Checklist: Patient identified, Emergency Drugs available, Suction available and Patient being monitored Patient Re-evaluated:Patient Re-evaluated prior to inductionOxygen Delivery Method: Circle System Utilized Preoxygenation: Pre-oxygenation with 100% oxygen Intubation Type: IV induction Ventilation: Mask ventilation without difficulty Laryngoscope Size: 4 and Mac Grade View: Grade II Tube type: Oral Number of attempts: 2 Airway Equipment and Method: bougie stylet and stylet Placement Confirmation: ETT inserted through vocal cords under direct vision,  positive ETCO2,  breath sounds checked- equal and bilateral and CO2 detector Secured at: 23 cm Tube secured with: Tape Dental Injury: Teeth and Oropharynx as per pre-operative assessment  Comments: Cords very tiny, not fully open.

## 2011-07-27 NOTE — Preoperative (Signed)
Beta Blockers   Reason not to administer Beta Blockers:Not Applicable 

## 2011-07-27 NOTE — H&P (Signed)
Jeffrey Fitzpatrick is an 60 y.o. male.   Chief Complaint: Bilateral leg pain. HPI: Patient is a 60 year old male status post 2 previous lumbar surgeries. Patient presents now with bilateral origin the pain paresthesias and weakness consistent with neurogenic claudication failing conservative management. 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 Patient presents now for L2-3 and L3-4 decompressive surgery.  Past Medical History  Diagnosis Date  . Hypertension     STRESS TEST OVER 4 YRS. NO CARDIAC MD    Past Surgical History  Procedure Date  . Back surgery     2 LUMBAR SURGERIERS  . Neck surgery     X 2    No family history on file. Social History:  reports that he quit smoking 8 days ago. His smoking use included Cigarettes. He does not have any smokeless tobacco history on file. He reports that he does not drink alcohol or use illicit drugs.  Allergies:  Allergies  Allergen Reactions  . Adhesive (Tape) Other (See Comments)    States he gets blisters from adhesive tape  . Betadine (Povidone Iodine) Other (See Comments)    Patient told me when I called about PCR swab that he has blisters wit Betadine use    Medications Prior to Admission  Medication Dose Route Frequency Provider Last Rate Last Dose  . ceFAZolin (ANCEF) IVPB 1 g/50 mL premix  1 g Intravenous 60 min Pre-Op Dejohn Ibarra A Dacoda Finlay      . dexamethasone (DECADRON) injection 10 mg  10 mg Intravenous Once [employer]       No current outpatient prescriptions on file as of .    No results found for this or any previous visit (from the past 48 hour(s)). No results found.  Review of Systems  All other systems reviewed and are negative.    There were no vitals taken for this visit. Physical Exam  Constitutional: He is oriented to person, place, and time. He appears well-developed and well-nourished.  HENT:  Head:  Normocephalic and atraumatic.  Eyes: Conjunctivae and EOM are normal. Pupils are equal, round, and reactive to light.  Neck: Normal range of motion. Neck supple. No tracheal deviation present. No thyromegaly present.  Cardiovascular: Normal rate, regular rhythm, normal heart sounds and intact distal pulses.   Respiratory: Effort normal and breath sounds normal. No respiratory distress. Tenderness: status post 2 previous lumbar surgeries. 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  GI: Soft. Bowel sounds are normal. He exhibits no distension. There is no tenderness.  Neurological: He is alert and oriented to person, place, and time. He has normal reflexes. He displays normal reflexes. No cranial nerve deficit. He exhibits normal muscle tone. Coordination normal.  Skin: Skin is warm and dry.  Psychiatric: He has a normal mood and affect. His behavior is normal. Judgment and thought content normal.   motor examination is normal bilaterally sensory examination is intact bilaterally deep tendon versus are hypoactive but symmetric. Gait and posture normal. 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 Plan L2-3 L3-4 decompressive laminectomy with bilateral L2 L3-L4 decompressive foraminotomies. Risks and benefits explained patient wishes to proceed.  [name] A 07/27/2011, 1:03 AM

## 2011-07-27 NOTE — Transfer of Care (Signed)
Immediate Anesthesia Transfer of Care Note  Patient: Jeffrey Fitzpatrick  Procedure(s) Performed:  LUMBAR LAMINECTOMY/DECOMPRESSION MICRODISCECTOMY - Lumbar Two-Three, Lumbar Three-Four Decompressive Lumbar Laminectomy   Patient Location: PACU  Anesthesia Type: General  Level of Consciousness: sedated, patient cooperative and responds to stimulation  Airway & Oxygen Therapy: Patient Spontanous Breathing and Patient connected to nasal cannula oxygen  Post-op Assessment: Report given to PACU RN, Post -op Vital signs reviewed and stable and Patient moving all extremities  Post vital signs: Reviewed and stable  Complications: No apparent anesthesia complications

## 2011-07-27 NOTE — Addendum Note (Signed)
Addendum  created 07/27/11 1237 by Darcey Nora   Modules edited:Anesthesia Events, Anesthesia Flowsheet

## 2011-07-27 NOTE — Addendum Note (Signed)
Addendum  created 07/27/11 1232 by Darcey Nora   Modules edited:Anesthesia Events, Anesthesia Flowsheet

## 2011-07-27 NOTE — Anesthesia Postprocedure Evaluation (Signed)
  Anesthesia Post-op Note  Patient: Jeffrey Fitzpatrick  Procedure(s) Performed:  LUMBAR LAMINECTOMY/DECOMPRESSION MICRODISCECTOMY - Lumbar Two-Three, Lumbar Three-Four Decompressive Lumbar Laminectomy   Patient Location: PACU  Anesthesia Type: General  Level of Consciousness: alert , oriented, sedated and patient cooperative  Airway and Oxygen Therapy: Patient Spontanous Breathing and Patient connected to nasal cannula oxygen  Post-op Pain: mild  Post-op Assessment: Post-op Vital signs reviewed, Patient's Cardiovascular Status Stable, Respiratory Function Stable, Patent Airway and No signs of Nausea or vomiting  Post-op Vital Signs: stable  Complications: No apparent anesthesia complications

## 2011-07-27 NOTE — Anesthesia Preprocedure Evaluation (Signed)
Anesthesia Evaluation  Patient identified by MRN, date of birth, ID band Patient awake    Reviewed: Allergy & Precautions, H&P , NPO status , Patient's Chart, lab work & pertinent test results  Airway Mallampati: I TM Distance: >3 FB Neck ROM: full    Dental  (+) Teeth Intact   Pulmonary sleep apnea ,    Pulmonary exam normal       Cardiovascular hypertension, regular Normal+ Systolic murmurs    Neuro/Psych Negative Neurological ROS  Negative Psych ROS   GI/Hepatic negative GI ROS, Neg liver ROS,   Endo/Other  Negative Endocrine ROS  Renal/GU negative Renal ROS  Genitourinary negative   Musculoskeletal negative musculoskeletal ROS (+)   Abdominal   Peds  Hematology negative hematology ROS (+)   Anesthesia Other Findings   Reproductive/Obstetrics negative OB ROS                           Anesthesia Physical Anesthesia Plan  ASA: III  Anesthesia Plan: General   Post-op Pain Management:    Induction: Intravenous  Airway Management Planned: Oral ETT  Additional Equipment:   Intra-op Plan:   Post-operative Plan: Extubation in OR  Informed Consent: I have reviewed the patients History and Physical, chart, labs and discussed the procedure including the risks, benefits and alternatives for the proposed anesthesia with the patient or authorized representative who has indicated his/her understanding and acceptance.     Plan Discussed with: CRNA, Anesthesiologist and Surgeon  Anesthesia Plan Comments:         Anesthesia Quick Evaluation

## 2011-07-30 ENCOUNTER — Encounter (HOSPITAL_COMMUNITY): Payer: Self-pay | Admitting: Neurosurgery

## 2011-12-21 ENCOUNTER — Ambulatory Visit (INDEPENDENT_AMBULATORY_CARE_PROVIDER_SITE_OTHER): Payer: Medicare Other | Admitting: General Surgery

## 2011-12-23 ENCOUNTER — Encounter (INDEPENDENT_AMBULATORY_CARE_PROVIDER_SITE_OTHER): Payer: Self-pay | Admitting: Surgery

## 2011-12-27 ENCOUNTER — Ambulatory Visit (INDEPENDENT_AMBULATORY_CARE_PROVIDER_SITE_OTHER): Payer: Medicare Other | Admitting: Surgery

## 2011-12-27 ENCOUNTER — Telehealth (INDEPENDENT_AMBULATORY_CARE_PROVIDER_SITE_OTHER): Payer: Self-pay | Admitting: Surgery

## 2011-12-27 ENCOUNTER — Encounter (INDEPENDENT_AMBULATORY_CARE_PROVIDER_SITE_OTHER): Payer: Self-pay | Admitting: Surgery

## 2011-12-27 VITALS — BP 168/102 | HR 72 | Temp 97.0°F | Resp 16 | Ht 71.0 in | Wt 275.2 lb

## 2011-12-27 DIAGNOSIS — K4090 Unilateral inguinal hernia, without obstruction or gangrene, not specified as recurrent: Secondary | ICD-10-CM | POA: Insufficient documentation

## 2011-12-27 NOTE — Progress Notes (Signed)
Patient ID: Jeffrey Fitzpatrick, male   DOB: 12-24-1950, 60 y.o.   MRN: [id number]  Chief Complaint  Patient presents with  . Inguinal Hernia    new pt- eval [facility]    HPI Jeffrey Fitzpatrick is a 61 y.o. male.  This is a very pleasant gentleman referred by Dr. Aida Puffer for evaluation of a left inguinal hernia. He has had the hernia for approximately 3 years. It is now getting larger and causing to have some mild discomfort. He has noticed a minimal bulge. He has had no history of incarceration. He has no other symptoms. The pain is described as a dull ache and refers down into the testicle HPI  Past Medical History  Diagnosis Date  . Hypertension     STRESS TEST OVER 4 YRS. NO CARDIAC MD  . Inguinal hernia     left    Past Surgical History  Procedure Date  . Back surgery     2 LUMBAR SURGERIERS  . Neck surgery     X 2  . Lumbar laminectomy/decompression microdiscectomy 07/27/2011    Procedure: LUMBAR LAMINECTOMY/DECOMPRESSION MICRODISCECTOMY;  Surgeon: Kathaleen Maser Pool;  Location: [facility];  Service: Neurosurgery;  Laterality: N/A;  Lumbar Two-Three, Lumbar Three-Four Decompressive Lumbar Laminectomy     Family History  Problem Relation Age of Onset  . Heart disease Father     Social History History  Substance Use Topics  . Smoking status: Never Smoker   . Smokeless tobacco: Not on file  . Alcohol Use: No    Allergies  Allergen Reactions  . Adhesive (Tape) Other (See Comments)    States he gets blisters from adhesive tape  . Benzoin Dermatitis  . Betadine (Povidone Iodine) Other (See Comments)    Patient told me when I called about PCR swab that he has blisters wit Betadine use    Current Outpatient Prescriptions  Medication Sig Dispense Refill  . amLODipine (NORVASC) 5 MG tablet Take 5 mg by mouth daily.        Marland Kitchen aspirin EC 81 MG tablet Take 81 mg by mouth daily.        Marland Kitchen levothyroxine (SYNTHROID, LEVOTHROID) 100 MCG tablet Take 100 mcg by mouth daily.        .  Multiple Vitamin (MULTIVITAMIN) tablet Take 1 tablet by mouth daily.        . valsartan (DIOVAN) 80 MG tablet Take 80 mg by mouth daily.          Review of Systems Review of Systems  Constitutional: Negative for fever, chills and unexpected weight change.  HENT: Negative for hearing loss, congestion, sore throat, trouble swallowing and voice change.   Eyes: Negative for visual disturbance.  Respiratory: Negative for cough and wheezing.   Cardiovascular: Negative for chest pain, palpitations and leg swelling.  Gastrointestinal: Positive for abdominal pain. Negative for nausea, vomiting, diarrhea, constipation, blood in stool, abdominal distention, anal bleeding and rectal pain.  Genitourinary: Negative for hematuria and difficulty urinating.  Musculoskeletal: Positive for back pain. Negative for arthralgias.  Skin: Negative for rash and wound.  Neurological: Negative for seizures, syncope, weakness and headaches.  Hematological: Negative for adenopathy. Does not bruise/bleed easily.  Psychiatric/Behavioral: Negative for confusion.    Blood pressure 168/102, pulse 72, temperature 97 F (36.1 C), temperature source Temporal, resp. rate 16, height 5\' 11"  (1.803 m), weight 275 lb 3.2 oz (124.83 kg).  Physical Exam Physical Exam  Constitutional: He is oriented to person, place, and time. He  appears well-developed and well-nourished. No distress.  HENT:  Head: Normocephalic and atraumatic.  Right Ear: External ear normal.  Left Ear: External ear normal.  Nose: Nose normal.  Mouth/Throat: Oropharynx is clear and moist.  Eyes: Conjunctivae and EOM are normal. Pupils are equal, round, and reactive to light. No scleral icterus.  Neck: Normal range of motion. Neck supple. No tracheal deviation present. No thyromegaly present.  Cardiovascular: Normal rate, regular rhythm, normal heart sounds and intact distal pulses.   No murmur heard. Pulmonary/Chest: Effort normal and breath sounds normal.  No respiratory distress. He has no wheezes. He has no rales.  Abdominal: Soft. Bowel sounds are normal. He exhibits no distension. There is no tenderness. There is no guarding.       There is a reducible left inguinal hernia. There is no evidence of right inguinal hernia  Musculoskeletal: Normal range of motion. He exhibits no edema and no tenderness.  Lymphadenopathy:    He has cervical adenopathy.  Neurological: He is alert and oriented to person, place, and time. No cranial nerve deficit.  Skin: Skin is warm and dry. No rash noted. No erythema.  Psychiatric: His behavior is normal. Judgment normal.    Data Reviewed   Assessment    Left inguinal hernia    Plan    As he is now symptomatic, repair with mesh was recommended. I discussed with the laparoscopic and open techniques. I discussed the risks of surgery which includes but is not limited to bleeding, infection, recurrence, nerve entrapment, chronic pain, et Karie Soda. He wishes to proceed with open repair with mesh. Surgery will be scheduled. Likely success is good       Dua Mehler A 12/27/2011, 9:50 AM

## 2012-01-03 ENCOUNTER — Ambulatory Visit (INDEPENDENT_AMBULATORY_CARE_PROVIDER_SITE_OTHER): Payer: Medicare Other | Admitting: General Surgery

## 2012-01-10 ENCOUNTER — Ambulatory Visit (INDEPENDENT_AMBULATORY_CARE_PROVIDER_SITE_OTHER): Payer: Medicare Other | Admitting: General Surgery

## 2012-01-17 ENCOUNTER — Telehealth (INDEPENDENT_AMBULATORY_CARE_PROVIDER_SITE_OTHER): Payer: Self-pay | Admitting: General Surgery

## 2012-01-17 NOTE — Telephone Encounter (Signed)
FYI Dr Clarene Duke wanted pt to see Cardiologist pt had abdnormal EKG today is his office his nurse Mardella Layman made the appt pt is seeing Dr Jacinto Halim on 01-18-12. They are aware that pt is schedule to have surgery on 01-28-12 for [facility]

## 2012-01-18 ENCOUNTER — Ambulatory Visit (INDEPENDENT_AMBULATORY_CARE_PROVIDER_SITE_OTHER): Payer: Medicare Other | Admitting: Cardiovascular Disease

## 2012-01-18 ENCOUNTER — Encounter: Payer: Self-pay | Admitting: Cardiovascular Disease

## 2012-01-18 ENCOUNTER — Encounter: Payer: Self-pay | Admitting: *Deleted

## 2012-01-18 VITALS — BP 168/84 | HR 64 | Ht 71.0 in | Wt 272.8 lb

## 2012-01-18 DIAGNOSIS — R9431 Abnormal electrocardiogram [ECG] [EKG]: Secondary | ICD-10-CM

## 2012-01-18 MED ORDER — VALSARTAN 160 MG PO TABS: 160.0000 mg | ORAL_TABLET | Freq: Every day | ORAL | Status: DC

## 2012-01-18 NOTE — Patient Instructions (Signed)
Your physician recommends that you schedule a follow-up appointment as needed with Dr. Excell Seltzer  Your physician has requested that you have an exercise stress myoview. For further information please visit [email or web address] Please follow instruction sheet, as given. Scheduled for Jan 19, 2012 at 11:30  Your physician has recommended you make the following change in your medication: Increase Diovan to 160 mg by mouth daily

## 2012-01-18 NOTE — Progress Notes (Addendum)
HPI:  This is a 61 year old gentleman referred for cardiac evaluation because of an abnormal EKG. The patient has developed a left inguinal hernia and is tentatively scheduled for annual hernia repair next week. He was seen by Dr. Clarene Duke and was noted to have an abnormal EKG. There are inferior and lateral T-wave changes and he was referred here for further evaluation.  The patient has no history of cardiac disease. He had a heart catheterization in 2001 that showed no evidence of obstructive coronary artery disease. He had presented with chest pain at that time. He's had no further problems. He does have hypertension with suboptimal control and reports that his blood pressure generally runs in the range of 150s over 70s. He describes exertional dyspnea with physical work. This is a long-standing complaint. He denies progressive symptoms. He denies orthopnea, PND, palpitations, lightheadedness, syncope, or chest pain or pressure. He can do physical work with only mild limitation from his breathing. He does a lot of yard work without significant problems.  Outpatient Encounter Prescriptions as of 01/18/2012  Medication Sig Dispense Refill  . amLODipine (NORVASC) 5 MG tablet Take 5 mg by mouth daily.        Marland Kitchen aspirin EC 81 MG tablet Take 81 mg by mouth daily.        Marland Kitchen levothyroxine (SYNTHROID, LEVOTHROID) 100 MCG tablet Take 100 mcg by mouth daily.        . Multiple Vitamin (MULTIVITAMIN) tablet Take 1 tablet by mouth daily.        . valsartan (DIOVAN) 160 MG tablet Take 1 tablet (160 mg total) by mouth daily.  30 tablet  11  . DISCONTD: valsartan (DIOVAN) 80 MG tablet Take 80 mg by mouth daily.          Adhesive; Benzoin; and Betadine  Past Medical History  Diagnosis Date  . Hypertension     STRESS TEST OVER 4 YRS. NO CARDIAC MD  . Inguinal hernia     left    Past Surgical History  Procedure Date  . Back surgery     2 LUMBAR SURGERIERS  . Neck surgery     X 2  . Lumbar  laminectomy/decompression microdiscectomy 07/27/2011    Procedure: LUMBAR LAMINECTOMY/DECOMPRESSION MICRODISCECTOMY;  Surgeon: Kathaleen Maser Pool;  Location: [facility];  Service: Neurosurgery;  Laterality: N/A;  Lumbar Two-Three, Lumbar Three-Four Decompressive Lumbar Laminectomy     History   Social History  . Marital Status: Married    Spouse Name: N/A    Number of Children: N/A  . Years of Education: N/A   Occupational History  . Not on file.   Social History Main Topics  . Smoking status: Former Smoker    Types: Cigarettes  . Smokeless tobacco: Not on file  . Alcohol Use: No  . Drug Use: No  . Sexually Active: Not on file   Other Topics Concern  . Not on file   Social History Narrative  . No narrative on file    Family History  Problem Relation Age of Onset  . Heart disease Father     ROS: General: no fevers/chills/night sweats Eyes: no blurry vision, diplopia, or amaurosis ENT: no sore throat or hearing loss Resp: no cough, wheezing, or hemoptysis CV: no edema or palpitations GI: no abdominal pain, nausea, vomiting, diarrhea, or constipation. Positive for acid reflux GU: no dysuria, frequency, or hematuria Skin: no rash Neuro: no headache, numbness, tingling, or weakness of extremities Musculoskeletal: Positive for back and  neck pain Heme: no bleeding, DVT, or easy bruising Endo: no polydipsia or polyuria  BP 168/84  Pulse 64  Ht 5\' 11"  (1.803 m)  Wt 123.741 kg (272 lb 12.8 oz)  BMI 38.05 kg/m2  PHYSICAL EXAM: Pt is alert and oriented, WD, WN, overweight male in no distress. HEENT: normal Neck: JVP normal. Carotid upstrokes normal without bruits. No thyromegaly. Lungs: equal expansion, clear bilaterally CV: Apex is discrete and nondisplaced, RRR without murmur or gallop Abd: soft, NT, +BS, no bruit, no hepatosplenomegaly Back: no CVA tenderness Ext: no C/C/E        Femoral pulses 2+= without bruits        DP/PT pulses intact and = Skin: warm and dry  without rash Neuro: CNII-XII intact             Strength intact = bilaterally  EKG:  Tracing from 01/17/2012 reviewed. This shows normal sinus rhythm with a heart rate of 60 beats per minute. There are ST and T wave changes in the inferolateral leads concerning for inferolateral ischemia versus LVH with repolarization abnormality.  ASSESSMENT AND PLAN: 1. Abnormal EKG. His EKG does raise concern and I think the most likely etiology of his EKG changes would relate to either ischemic heart disease or left ventricular hypertrophy with repolarization abnormality. He really has minimal cardiac symptoms at a fairly high workload. Because of his abnormal EKG, I have recommended an exercise Myoview stress test to rule out significant ischemia. He has been doing physical work and exertion and this has not particularly bothered his inguinal hernia so I feel it is safe to walk him on a treadmill. Will provide final surgical clearance after his stress test is completed. He was supposed to have a stress test about one year ago after his EKG abnormality was initially picked up, but he declined to follow through at that point.  2. Essential hypertension with suboptimal control. I recommended that he increase his Diovan to 160 mg daily and continue on amlodipine. His amlodipine could be increased to 10 mg if he continues to have systolic readings greater than 140. I will leave further management to Dr. Clarene Duke.  3. Followup. Pending the patient stress test. If this is abnormal he will likely require cardiac cath before surgery. If the stress test is within normal limits I would plan on seeing him back on an as needed basis since he has good primary care follow-up.   ADDENDUM (01/20/2012): Myoview stress test reviewed. There was no skinnier. The study was normal. The patient can proceed with surgery without further testing. He can hold aspirin prior to surgery at low risk of cardiac problems.  Tonny Bollman 01/20/2012 10:58 PM

## 2012-01-19 ENCOUNTER — Ambulatory Visit (HOSPITAL_COMMUNITY): Payer: Medicare Other | Attending: Cardiovascular Disease | Admitting: Radiology

## 2012-01-19 ENCOUNTER — Telehealth: Payer: Self-pay | Admitting: Cardiovascular Disease

## 2012-01-19 VITALS — BP 174/94 | Ht 71.0 in | Wt 270.0 lb

## 2012-01-19 DIAGNOSIS — Z01810 Encounter for preprocedural cardiovascular examination: Secondary | ICD-10-CM | POA: Insufficient documentation

## 2012-01-19 DIAGNOSIS — Z87891 Personal history of nicotine dependence: Secondary | ICD-10-CM | POA: Insufficient documentation

## 2012-01-19 DIAGNOSIS — R9431 Abnormal electrocardiogram [ECG] [EKG]: Secondary | ICD-10-CM | POA: Insufficient documentation

## 2012-01-19 DIAGNOSIS — I10 Essential (primary) hypertension: Secondary | ICD-10-CM | POA: Insufficient documentation

## 2012-01-19 DIAGNOSIS — R0989 Other specified symptoms and signs involving the circulatory and respiratory systems: Secondary | ICD-10-CM | POA: Insufficient documentation

## 2012-01-19 DIAGNOSIS — R0609 Other forms of dyspnea: Secondary | ICD-10-CM | POA: Insufficient documentation

## 2012-01-19 DIAGNOSIS — R0602 Shortness of breath: Secondary | ICD-10-CM

## 2012-01-19 MED ORDER — TECHNETIUM TC 99M TETROFOSMIN IV KIT
33.0000 | PACK | Freq: Once | INTRAVENOUS | Status: AC | PRN
  Administered 2012-01-19: 33 via INTRAVENOUS

## 2012-01-19 MED ORDER — TECHNETIUM TC 99M TETROFOSMIN IV KIT
10.9000 | PACK | Freq: Once | INTRAVENOUS | Status: AC | PRN
  Administered 2012-01-19: 10.9 via INTRAVENOUS

## 2012-01-19 NOTE — Progress Notes (Signed)
[facility] SITE 3 NUCLEAR MED [address] [place] [address] [phone]  Cardiology Nuclear Med Study  Jeffrey Fitzpatrick is a 61 y.o. male     MRN : [id number]     DOB: 20-Sep-1950  Procedure Date: 01/19/2012  Nuclear Med Background Indication for Stress Test:  Evaluation for Ischemia, Abnormal EKG and Pending Clearance for Hernia Repair on 01/28/12 by Dr. Abigail Miyamoto History:  '01 Cath:No flow limiting CAD, EF=60%. Cardiac Risk Factors: History of Smoking and Hypertension  Symptoms:  DOE   Nuclear Pre-Procedure Caffeine/Decaff Intake:  None NPO After: 7:00pm   Lungs:  clear O2 Sat: 99% on room air. IV 0.9% NS with Angio Cath:  20g  IV Site: R Antecubital  IV Started by:  Bonnita Levan, RN  Chest Size (in):  48 Cup Size: n/a  Height: 5\' 11"  (1.803 m)  Weight:  270 lb (122.471 kg)  BMI:  Body mass index is 37.66 kg/(m^2). Tech Comments:  AM meds. taken    Nuclear Med Study 1 or 2 day study: 1 day  Stress Test Type:  Stress  Reading MD: Charlton Haws, MD  Order Authorizing Provider:  Tonny Bollman, MD  Resting Radionuclide: Technetium 59m Tetrofosmin  Resting Radionuclide Dose: 10.9 mCi   Stress Radionuclide:  Technetium 86m Tetrofosmin  Stress Radionuclide Dose: 33.0 mCi           Stress Protocol Rest HR: 64 Stress HR: 141  Rest BP: 174/94 Stress BP: 232/66  Exercise Time (min): 7:01 METS: 7.7   Predicted Max HR: 158 bpm % Max HR: 89.24 bpm Rate Pressure Product: 78469   Dose of Adenosine (mg):  n/a Dose of Lexiscan: n/a mg  Dose of Atropine (mg): n/a Dose of Dobutamine: n/a mcg/kg/min (at max HR)  Stress Test Technologist: Smiley Houseman, CMA-N  Nuclear Technologist:  Domenic Polite, CNMT     Rest Procedure:  Myocardial perfusion imaging was performed at rest 45 minutes following the intravenous administration of Technetium 62m Tetrofosmin.  Rest ECG: ST-T wave changes.  Stress Procedure:  The patient exercised on the treadmill  utilizing the Bruce protocol for 7:01 minutes. He then stopped due to fatigue and denied any chest pain.  There were ST-T wave changes and a hypertensive response to exercise, 232/66.  Technetium 20m Tetrofosmin was injected at peak exercise and myocardial perfusion imaging was performed after a brief delay.  Stress ECG: Nondiagnostic due to baseline LVH with strain  QPS Raw Data Images:  Patient motion noted. Stress Images:  Normal homogeneous uptake in all areas of the myocardium. Rest Images:  Normal homogeneous uptake in all areas of the myocardium. Subtraction (SDS):  Normal Transient Ischemic Dilatation (Normal <1.22):  0.93 Lung/Heart Ratio (Normal <0.45):  0.30  Quantitative Gated Spect Images QGS EDV:  129 ml QGS ESV:  55 ml  Impression Exercise Capacity:  Fair exercise capacity. BP Response:  Hypertensive blood pressure response. Clinical Symptoms:  No significant symptoms noted. ECG Impression:  Baseline LVH with strain Comparison with Prior Nuclear Study: No previous nuclear study performed  Overall Impression:  Normal stress nuclear study.  LV Ejection Fraction: 58%.  LV Wall Motion:  NL LV Function; NL Wall Motion  LVH on ECG and hypertensive response to exercise  Charlton Haws

## 2012-01-19 NOTE — Telephone Encounter (Signed)
Pt had myoview performed today.  Will have Dr Excell Seltzer review Jeffrey Fitzpatrick for cardiac clearance and give approval to hold ASA 5 days prior to surgery (pt does not take plavix). I made Pattricia Boss aware that I will forward this request to Dr Excell Seltzer.

## 2012-01-19 NOTE — Telephone Encounter (Signed)
New problem:  Per Pattricia Boss - [facility]. Need cardiac clearance for upcoming on 5/17. Please advise to stop plavix 5 days prior.

## 2012-01-20 ENCOUNTER — Encounter (HOSPITAL_COMMUNITY): Payer: Self-pay

## 2012-01-20 ENCOUNTER — Encounter (HOSPITAL_COMMUNITY)
Admission: RE | Admit: 2012-01-20 | Discharge: 2012-01-20 | Disposition: A | Discharge status: Home / Self Care | Payer: Medicare Other | Source: Ambulatory Visit | Attending: Surgery | Admitting: Surgery

## 2012-01-20 ENCOUNTER — Encounter (HOSPITAL_COMMUNITY): Payer: Self-pay | Admitting: Pharmacy Technician

## 2012-01-20 HISTORY — DX: Hypothyroidism, unspecified: E03.9

## 2012-01-20 NOTE — Patient Instructions (Signed)
20 Jeffrey Fitzpatrick  01/20/2012   Your procedure is scheduled on:  Friday 01/28/2012 0730am  Report to [facility] at 0530 AM.  Call this number if you have problems the morning of surgery: [phone]   Remember:   Do not eat food:After Midnight.  May have clear liquids:until Midnight .    Take these medicines the morning of surgery with A SIP OF WATER: Norvasc, Synthroid   Do not wear jewelry, make-up or nail polish.  Do not wear lotions, powders, or perfumes. .  Do not shave 48 hours prior to surgery-women only-shaving legs  Do not bring valuables to the hospital.  Contacts, dentures or bridgework may not be worn into surgery.  Leave suitcase in the car. After surgery it may be brought to your room.  For patients admitted to the hospital, checkout time is 11:00 AM the day of discharge.   Patients discharged the day of surgery will not be allowed to drive home.  Name and phone number of your driver: Heidi-spouse [phone]  Special Instructions: CHG Shower Use Special Wash: 1/2 bottle night before surgery and 1/2 bottle morning of surgery.   Please read over the following fact sheets that you were given: MRSA Information, Sleep Apnea sheet, Incentive Spirometry sheet

## 2012-01-20 NOTE — Pre-Procedure Instructions (Signed)
Labs done at Dr. Fayrene Fearing Little's office on 01/17/2012 and on chart-so labs not done at Pre-Admissions visit.

## 2012-01-27 NOTE — H&P (Signed)
Patient ID: Jeffrey Fitzpatrick, male DOB: [birth year], 61 y.o. MRN: [id number]  Chief Complaint   Patient presents with   .  Inguinal Hernia     new pt- eval [facility]    HPI  Jeffrey Fitzpatrick is a 61 y.o. male. This is a very pleasant gentleman referred by Dr. Aida Puffer for evaluation of a left inguinal hernia. He has had the hernia for approximately 3 years. It is now getting larger and causing to have some mild discomfort. He has noticed a minimal bulge. He has had no history of incarceration. He has no other symptoms. The pain is described as a dull ache and refers down into the testicle  HPI  Past Medical History   Diagnosis  Date   .  Hypertension      STRESS TEST OVER 4 YRS. NO CARDIAC MD   .  Inguinal hernia      left    Past Surgical History   Procedure  Date   .  Back surgery      2 LUMBAR SURGERIERS   .  Neck surgery      X 2   .  Lumbar laminectomy/decompression microdiscectomy  07/27/2011     Procedure: LUMBAR LAMINECTOMY/DECOMPRESSION MICRODISCECTOMY; Surgeon: Kathaleen Maser Pool; Location: [facility]; Service: Neurosurgery; Laterality: N/A; Lumbar Two-Three, Lumbar Three-Four Decompressive Lumbar Laminectomy    Family History   Problem  Relation  Age of Onset   .  Heart disease  Father     Social History  History   Substance Use Topics   .  Smoking status:  Never Smoker   .  Smokeless tobacco:  Not on file   .  Alcohol Use:  No    Allergies   Allergen  Reactions   .  Adhesive (Tape)  Other (See Comments)     States he gets blisters from adhesive tape   .  Benzoin  Dermatitis   .  Betadine (Povidone Iodine)  Other (See Comments)     Patient told me when I called about PCR swab that he has blisters wit Betadine use    Current Outpatient Prescriptions   Medication  Sig  Dispense  Refill   .  amLODipine (NORVASC) 5 MG tablet  Take 5 mg by mouth daily.     Marland Kitchen  aspirin EC 81 MG tablet  Take 81 mg by mouth daily.     Marland Kitchen  levothyroxine (SYNTHROID, LEVOTHROID) 100 MCG tablet   Take 100 mcg by mouth daily.     .  Multiple Vitamin (MULTIVITAMIN) tablet  Take 1 tablet by mouth daily.     .  valsartan (DIOVAN) 80 MG tablet  Take 80 mg by mouth daily.      Review of Systems  Review of Systems  Constitutional: Negative for fever, chills and unexpected weight change.  HENT: Negative for hearing loss, congestion, sore throat, trouble swallowing and voice change.  Eyes: Negative for visual disturbance.  Respiratory: Negative for cough and wheezing.  Cardiovascular: Negative for chest pain, palpitations and leg swelling.  Gastrointestinal: Positive for abdominal pain. Negative for nausea, vomiting, diarrhea, constipation, blood in stool, abdominal distention, anal bleeding and rectal pain.  Genitourinary: Negative for hematuria and difficulty urinating.  Musculoskeletal: Positive for back pain. Negative for arthralgias.  Skin: Negative for rash and wound.  Neurological: Negative for seizures, syncope, weakness and headaches.  Hematological: Negative for adenopathy. Does not bruise/bleed easily.  Psychiatric/Behavioral: Negative for confusion.   Blood pressure  168/102, pulse 72, temperature 97 F (36.1 C), temperature source Temporal, resp. rate 16, height 5\' 11"  (1.803 m), weight 275 lb 3.2 oz (124.83 kg).  Physical Exam  Physical Exam  Constitutional: He is oriented to person, place, and time. He appears well-developed and well-nourished. No distress.  HENT:  Head: Normocephalic and atraumatic.  Right Ear: External ear normal.  Left Ear: External ear normal.  Nose: Nose normal.  Mouth/Throat: Oropharynx is clear and moist.  Eyes: Conjunctivae and EOM are normal. Pupils are equal, round, and reactive to light. No scleral icterus.  Neck: Normal range of motion. Neck supple. No tracheal deviation present. No thyromegaly present.  Cardiovascular: Normal rate, regular rhythm, normal heart sounds and intact distal pulses.  No murmur heard.  Pulmonary/Chest: Effort  normal and breath sounds normal. No respiratory distress. He has no wheezes. He has no rales.  Abdominal: Soft. Bowel sounds are normal. He exhibits no distension. There is no tenderness. There is no guarding.  There is a reducible left inguinal hernia. There is no evidence of right inguinal hernia  Musculoskeletal: Normal range of motion. He exhibits no edema and no tenderness.  Lymphadenopathy:  He has cervical adenopathy.  Neurological: He is alert and oriented to person, place, and time. No cranial nerve deficit.  Skin: Skin is warm and dry. No rash noted. No erythema.  Psychiatric: His behavior is normal. Judgment normal.   Data Reviewed  Assessment   Left inguinal hernia   Plan   As he is now symptomatic, repair with mesh was recommended. I discussed with the laparoscopic and open techniques. I discussed the risks of surgery which includes but is not limited to bleeding, infection, recurrence, nerve entrapment, chronic pain, et Karie Soda. He wishes to proceed with open repair with mesh. Surgery will be scheduled. Likely success is good   Sheddrick Lattanzio A

## 2012-01-28 ENCOUNTER — Encounter (HOSPITAL_COMMUNITY): Payer: Self-pay | Admitting: Anesthesiology

## 2012-01-28 ENCOUNTER — Encounter (HOSPITAL_COMMUNITY)
Admission: RE | Disposition: A | Discharge status: Home / Self Care | Payer: Self-pay | Source: Ambulatory Visit | Attending: Surgery

## 2012-01-28 ENCOUNTER — Ambulatory Visit (HOSPITAL_COMMUNITY): Payer: Medicare Other | Admitting: Anesthesiology

## 2012-01-28 ENCOUNTER — Encounter (HOSPITAL_COMMUNITY): Payer: Self-pay | Admitting: *Deleted

## 2012-01-28 ENCOUNTER — Ambulatory Visit (HOSPITAL_COMMUNITY)
Admission: RE | Admit: 2012-01-28 | Discharge: 2012-01-28 | Disposition: A | Discharge status: Home / Self Care | Payer: Medicare Other | Source: Ambulatory Visit | Attending: Surgery | Admitting: Surgery

## 2012-01-28 DIAGNOSIS — Z79899 Other long term (current) drug therapy: Secondary | ICD-10-CM | POA: Insufficient documentation

## 2012-01-28 DIAGNOSIS — K4090 Unilateral inguinal hernia, without obstruction or gangrene, not specified as recurrent: Secondary | ICD-10-CM

## 2012-01-28 DIAGNOSIS — Z01812 Encounter for preprocedural laboratory examination: Secondary | ICD-10-CM | POA: Insufficient documentation

## 2012-01-28 HISTORY — PX: HERNIA REPAIR: SHX51

## 2012-01-28 HISTORY — PX: INGUINAL HERNIA REPAIR: SHX194

## 2012-01-28 SURGERY — REPAIR, HERNIA, INGUINAL, ADULT
Anesthesia: General | Site: Groin | Wound class: Clean

## 2012-01-28 MED ORDER — MORPHINE SULFATE 10 MG/ML IJ SOLN: 4.0000 mg | INTRAMUSCULAR | Status: DC | PRN

## 2012-01-28 MED ORDER — ACETAMINOPHEN 10 MG/ML IV SOLN
INTRAVENOUS | Status: AC
  Filled 2012-01-28: qty 100

## 2012-01-28 MED ORDER — CEFAZOLIN SODIUM 1-5 GM-% IV SOLN
INTRAVENOUS | Status: AC
  Filled 2012-01-28: qty 100

## 2012-01-28 MED ORDER — LACTATED RINGERS IV SOLN: INTRAVENOUS | Status: DC

## 2012-01-28 MED ORDER — SODIUM CHLORIDE 0.9 % IV SOLN: 250.0000 mL | INTRAVENOUS | Status: DC | PRN

## 2012-01-28 MED ORDER — ACETAMINOPHEN 325 MG PO TABS: 650.0000 mg | ORAL_TABLET | ORAL | Status: DC | PRN

## 2012-01-28 MED ORDER — ACETAMINOPHEN 10 MG/ML IV SOLN
INTRAVENOUS | Status: DC | PRN
  Administered 2012-01-28: 1000 mg via INTRAVENOUS

## 2012-01-28 MED ORDER — MIDAZOLAM HCL 5 MG/5ML IJ SOLN
INTRAMUSCULAR | Status: DC | PRN
  Administered 2012-01-28: 2 mg via INTRAVENOUS

## 2012-01-28 MED ORDER — CEFAZOLIN SODIUM-DEXTROSE 2-3 GM-% IV SOLR
2.0000 g | INTRAVENOUS | Status: AC
  Administered 2012-01-28: 2 g via INTRAVENOUS

## 2012-01-28 MED ORDER — SODIUM CHLORIDE 0.9 % IJ SOLN: 3.0000 mL | INTRAMUSCULAR | Status: DC | PRN

## 2012-01-28 MED ORDER — BUPIVACAINE HCL (PF) 0.5 % IJ SOLN
INTRAMUSCULAR | Status: AC
  Filled 2012-01-28: qty 30

## 2012-01-28 MED ORDER — ONDANSETRON HCL 4 MG/2ML IJ SOLN: 4.0000 mg | Freq: Four times a day (QID) | INTRAMUSCULAR | Status: DC | PRN

## 2012-01-28 MED ORDER — BUPIVACAINE HCL (PF) 0.5 % IJ SOLN
INTRAMUSCULAR | Status: DC | PRN
  Administered 2012-01-28: 10 mL

## 2012-01-28 MED ORDER — OXYCODONE HCL 5 MG PO TABS
ORAL_TABLET | ORAL | Status: AC
  Administered 2012-01-28: 5 mg via ORAL
  Filled 2012-01-28: qty 1

## 2012-01-28 MED ORDER — ACETAMINOPHEN 650 MG RE SUPP
650.0000 mg | RECTAL | Status: DC | PRN
  Filled 2012-01-28: qty 1

## 2012-01-28 MED ORDER — HYDROMORPHONE HCL PF 1 MG/ML IJ SOLN: 0.2500 mg | INTRAMUSCULAR | Status: DC | PRN

## 2012-01-28 MED ORDER — HYDROCODONE-ACETAMINOPHEN 5-325 MG PO TABS: 1.0000 | ORAL_TABLET | ORAL | Status: AC | PRN

## 2012-01-28 MED ORDER — LIDOCAINE HCL (CARDIAC) 20 MG/ML IV SOLN
INTRAVENOUS | Status: DC | PRN
  Administered 2012-01-28: 50 mg via INTRAVENOUS

## 2012-01-28 MED ORDER — EPHEDRINE SULFATE 50 MG/ML IJ SOLN
INTRAMUSCULAR | Status: DC | PRN
  Administered 2012-01-28 (×3): 5 mg via INTRAVENOUS

## 2012-01-28 MED ORDER — MEPERIDINE HCL 50 MG/ML IJ SOLN: 6.2500 mg | INTRAMUSCULAR | Status: DC | PRN

## 2012-01-28 MED ORDER — KETOROLAC TROMETHAMINE 30 MG/ML IJ SOLN
INTRAMUSCULAR | Status: DC | PRN
  Administered 2012-01-28: 30 mg via INTRAVENOUS

## 2012-01-28 MED ORDER — ONDANSETRON HCL 4 MG/2ML IJ SOLN
INTRAMUSCULAR | Status: DC | PRN
  Administered 2012-01-28: 4 mg via INTRAVENOUS

## 2012-01-28 MED ORDER — SODIUM CHLORIDE 0.9 % IJ SOLN: 3.0000 mL | Freq: Two times a day (BID) | INTRAMUSCULAR | Status: DC

## 2012-01-28 MED ORDER — LACTATED RINGERS IV SOLN
INTRAVENOUS | Status: DC | PRN
  Administered 2012-01-28: 07:00:00 via INTRAVENOUS

## 2012-01-28 MED ORDER — OXYCODONE HCL 5 MG PO TABS
5.0000 mg | ORAL_TABLET | ORAL | Status: DC | PRN
  Administered 2012-01-28: 5 mg via ORAL

## 2012-01-28 MED ORDER — FENTANYL CITRATE 0.05 MG/ML IJ SOLN
INTRAMUSCULAR | Status: DC | PRN
  Administered 2012-01-28: 100 ug via INTRAVENOUS

## 2012-01-28 MED ORDER — PROMETHAZINE HCL 25 MG/ML IJ SOLN: 6.2500 mg | INTRAMUSCULAR | Status: DC | PRN

## 2012-01-28 SURGICAL SUPPLY — 42 items
ADH SKN CLS APL DERMABOND .7 (GAUZE/BANDAGES/DRESSINGS) ×2
BLADE HEX COATED 2.75 (ELECTRODE) ×3 IMPLANT
BLADE SURG 15 STRL LF DISP TIS (BLADE) ×2 IMPLANT
BLADE SURG 15 STRL SS (BLADE) ×3
BLADE SURG SZ10 CARB STEEL (BLADE) ×3 IMPLANT
CANISTER SUCTION 2500CC (MISCELLANEOUS) ×3 IMPLANT
CHLORAPREP W/TINT 26ML (MISCELLANEOUS) ×3 IMPLANT
CLOTH BEACON ORANGE TIMEOUT ST (SAFETY) ×3 IMPLANT
DECANTER SPIKE VIAL GLASS SM (MISCELLANEOUS) IMPLANT
DERMABOND ADVANCED (GAUZE/BANDAGES/DRESSINGS) ×1
DERMABOND ADVANCED .7 DNX12 (GAUZE/BANDAGES/DRESSINGS) IMPLANT
DRAIN PENROSE 18X1/2 LTX STRL (DRAIN) ×3 IMPLANT
DRAPE LAPAROTOMY TRNSV 102X78 (DRAPE) ×3 IMPLANT
ELECT REM PT RETURN 9FT ADLT (ELECTROSURGICAL) ×3
ELECTRODE REM PT RTRN 9FT ADLT (ELECTROSURGICAL) ×2 IMPLANT
GAUZE SPONGE 4X4 16PLY XRAY LF (GAUZE/BANDAGES/DRESSINGS) IMPLANT
GLOVE SURG SIGNA 7.5 PF LTX (GLOVE) ×6 IMPLANT
GOWN STRL NON-REIN LRG LVL3 (GOWN DISPOSABLE) ×3 IMPLANT
GOWN STRL REIN XL XLG (GOWN DISPOSABLE) ×6 IMPLANT
KIT BASIN OR (CUSTOM PROCEDURE TRAY) ×3 IMPLANT
MESH PARIETEX PROGRIP LEFT (Mesh General) ×1 IMPLANT
NDL HYPO 25X1 1.5 SAFETY (NEEDLE) ×2 IMPLANT
NEEDLE HYPO 22GX1.5 SAFETY (NEEDLE) IMPLANT
NEEDLE HYPO 25X1 1.5 SAFETY (NEEDLE) ×3 IMPLANT
NS IRRIG 1000ML POUR BTL (IV SOLUTION) ×3 IMPLANT
PACK BASIC VI WITH GOWN DISP (CUSTOM PROCEDURE TRAY) ×3 IMPLANT
PENCIL BUTTON HOLSTER BLD 10FT (ELECTRODE) ×3 IMPLANT
SPONGE GAUZE 4X4 12PLY (GAUZE/BANDAGES/DRESSINGS) IMPLANT
SPONGE LAP 4X18 X RAY DECT (DISPOSABLE) ×3 IMPLANT
STRIP CLOSURE SKIN 1/2X4 (GAUZE/BANDAGES/DRESSINGS) IMPLANT
SUT MNCRL AB 4-0 PS2 18 (SUTURE) ×3 IMPLANT
SUT SILK 2 0 SH (SUTURE) ×3 IMPLANT
SUT VIC AB 2-0 CT1 27 (SUTURE)
SUT VIC AB 2-0 CT1 TAPERPNT 27 (SUTURE) IMPLANT
SUT VIC AB 3-0 54XBRD REEL (SUTURE) IMPLANT
SUT VIC AB 3-0 BRD 54 (SUTURE)
SUT VIC AB 3-0 SH 27 (SUTURE)
SUT VIC AB 3-0 SH 27XBRD (SUTURE) IMPLANT
SYR BULB IRRIGATION 50ML (SYRINGE) ×1 IMPLANT
SYR CONTROL 10ML LL (SYRINGE) ×3 IMPLANT
TOWEL OR 17X26 10 PK STRL BLUE (TOWEL DISPOSABLE) ×3 IMPLANT
YANKAUER SUCT BULB TIP 10FT TU (MISCELLANEOUS) ×3 IMPLANT

## 2012-01-28 NOTE — Anesthesia Postprocedure Evaluation (Signed)
  Anesthesia Post-op Note  Patient: Jeffrey Fitzpatrick  Procedure(s) Performed: Procedure(s) (LRB): HERNIA REPAIR INGUINAL ADULT (Left) INSERTION OF MESH (N/A)  Patient Location: PACU  Anesthesia Type: General  Level of Consciousness: awake and alert   Airway and Oxygen Therapy: Patient Spontanous Breathing  Post-op Pain: mild  Post-op Assessment: Post-op Vital signs reviewed, Patient's Cardiovascular Status Stable, Respiratory Function Stable, Patent Airway and No signs of Nausea or vomiting  Post-op Vital Signs: stable  Complications: No apparent anesthesia complications

## 2012-01-28 NOTE — Discharge Instructions (Signed)
[facility] _______[facility]  UMBILICAL OR INGUINAL HERNIA REPAIR: POST OP INSTRUCTIONS  Always review your discharge instruction sheet given to you by the facility where your surgery was performed. IF YOU HAVE DISABILITY OR FAMILY LEAVE FORMS, YOU MUST BRING THEM TO THE OFFICE FOR PROCESSING.   DO NOT GIVE THEM TO YOUR DOCTOR.  1. A  prescription for pain medication may be given to you upon discharge.  Take your pain medication as prescribed, if needed.  If narcotic pain medicine is not needed, then you may take acetaminophen (Tylenol) or ibuprofen (Advil) as needed. 2. Take your usually prescribed medications unless otherwise directed. 3. If you need a refill on your pain medication, please contact your pharmacy.  They will contact our office to request authorization. Prescriptions will not be filled after 5 pm or on week-ends. 4. You should follow a light diet the first 24 hours after arrival home, such as soup and crackers, etc.  Be sure to include lots of fluids daily.  Resume your normal diet the day after surgery. 5. Most patients will experience some swelling and bruising around the umbilicus or in the groin and scrotum.  Ice packs and reclining will help.  Swelling and bruising can take several days to resolve.  6. It is common to experience some constipation if taking pain medication after surgery.  Increasing fluid intake and taking a stool softener (such as Colace) will usually help or prevent this problem from occurring.  A mild laxative (Milk of Magnesia or Miralax) should be taken according to package directions if there are no bowel movements after 48 hours. 7. Unless discharge instructions indicate otherwise, you may remove your bandages 24-48 hours after surgery, and you may shower at that time.  You may have steri-strips (small skin tapes) in place directly over the incision.  These strips should be left on the skin for 7-10 days.  If your surgeon used skin glue on the  incision, you may shower in 24 hours.  The glue will flake off over the next 2-3 weeks.  Any sutures or staples will be removed at the office during your follow-up visit. 8. ACTIVITIES:  You may resume regular (light) daily activities beginning the next day--such as daily self-care, walking, climbing stairs--gradually increasing activities as tolerated.  You may have sexual intercourse when it is comfortable.  Refrain from any heavy lifting or straining until approved by your doctor. a. You may drive when you are no longer taking prescription pain medication, you can comfortably wear a seatbelt, and you can safely maneuver your car and apply brakes. b. RETURN TO WORK:  __________________________________________________________ 9. You should see your doctor in the office for a follow-up appointment approximately 2-3 weeks after your surgery.  Make sure that you call for this appointment within a day or two after you arrive home to insure a convenient appointment time. 10. OTHER INSTRUCTIONS:  NO LIFTING MORE THAN 20 LBS FOR 4 WEEKS.   ICE PACK AND IBUPROFEN AS NEEDED FOR PAIN __________________________________________________________________________________________________________________________________________________________________________________________  WHEN TO CALL YOUR DOCTOR: 1. Fever over 101.0 2. Inability to urinate 3. Nausea and/or vomiting 4. Extreme swelling or bruising 5. Continued bleeding from incision. 6. Increased pain, redness, or drainage from the incision  The clinic staff is available to answer your questions during regular business hours.  Please don't hesitate to call and ask to speak to one of the nurses for clinical concerns.  If you have a medical emergency, go to the nearest emergency room  or call 911.  A surgeon from [facility] is always on call at the hospital   [address], [address], [place], [place]  [address] ?  [address] [address], [place], [place]    [address] [phone] ? [phone] ? FAX [phone] Web site: [email or web address]

## 2012-01-28 NOTE — Interval H&P Note (Signed)
History and Physical Interval Note:  He has had no change in his history or exam  01/28/2012 7:03 AM  Jeffrey Fitzpatrick  has presented today for surgery, with the diagnosis of left inguinal hernia  The various methods of treatment have been discussed with the patient and family. After consideration of risks, benefits and other options for treatment, the patient has consented to  Procedure(s) (LRB): HERNIA REPAIR INGUINAL ADULT (Left) INSERTION OF MESH (N/A) as a surgical intervention .  The patients' history has been reviewed, patient examined, no change in status, stable for surgery.  I have reviewed the patients' chart and labs.  Questions were answered to the patient's satisfaction.     Jeffrey Fitzpatrick A

## 2012-01-28 NOTE — Progress Notes (Signed)
Patient ambulated to restroom and tolerated well.  

## 2012-01-28 NOTE — Op Note (Signed)
HERNIA REPAIR INGUINAL ADULT, INSERTION OF MESH  Procedure Note  Jeffrey Fitzpatrick 01/28/2012   Pre-op Diagnosis: left inguinal hernia     Post-op Diagnosis: same  Procedure(s): HERNIA REPAIR INGUINAL ADULT INSERTION OF MESH (Pro-grip prolene mesh)  Surgeon(s): Shelly Rubenstein, MD  Anesthesia: General  Staff:  Gerda Diss, RN - [facility], CST - Scrub Person  Estimated Blood Loss: Minimal               Findings: The patient was found to have a direct left inguinal hernia. It was repaired with a piece of pro-grip Prolene mesh.  Procedure: The patient was brought to the operating room and identified as the correct patient. He was placed supine on the operating room table and general anesthesia was induced. His left groin was then prepped and draped in the usual sterile fashion. I performed an ilioinguinal nerve block with Marcaine. I anesthetized the skin with Marcaine as well. I then made a longitudinal incision with a scalpel in the left lower quadrant. I took this down through Scarpa's fascia with electric cautery. The external oblique fascia was identified and opened up to the internal and external rings. The patient had a moderate-sized direct hernia defect. I controlled the testicular cord structures with a Penrose drain. I did not see evidence of any indirect inguinal hernia. I reduced the sac and enclosed tissue over the top with a 2-0 suture. A piece of Prolene pro-grip mesh was then brought to the field.I placed the mesh around the cord structures and secured it to the inguinal floor. I then used a single Vicryl suture to secure it to the pubic tubercle. Good coverage of the inguinal floor appeared to be achieved. I then closed the external oblique fascia over the top of the mesh with a running 2-0 Vicryl suture. I anesthetized the fashion skin for marking. I then closed the Scarpa's fascia with 3-0 Vicryl sutures and closed the skin with a running 4-0  Monocryl. Dermabond was applied. The patient tolerated the procedure well. All counts were correct at the end of the procedure. The patient was then extubated in the operating room and taken in a stable condition to the recovery room.          [name] A   Date: 01/28/2012  Time: 7:55 AM

## 2012-01-28 NOTE — Anesthesia Preprocedure Evaluation (Addendum)
Anesthesia Evaluation  Patient identified by MRN, date of birth, ID band Patient awake    Reviewed: Allergy & Precautions, H&P , NPO status , Patient's Chart, lab work & pertinent test results  Airway Mallampati: I TM Distance: >3 FB Neck ROM: full    Dental  (+) Teeth Intact   Pulmonary sleep apnea ,    Pulmonary exam normal       Cardiovascular hypertension, Pt. on medications Rhythm:regular Rate:Normal - Systolic murmurs    Neuro/Psych negative neurological ROS  negative psych ROS   GI/Hepatic negative GI ROS, Neg liver ROS,   Endo/Other  negative endocrine ROSHypothyroidism   Renal/GU negative Renal ROS  negative genitourinary   Musculoskeletal negative musculoskeletal ROS (+)   Abdominal   Peds  Hematology negative hematology ROS (+)   Anesthesia Other Findings   Reproductive/Obstetrics negative OB ROS                           Anesthesia Physical  Anesthesia Plan  ASA: II  Anesthesia Plan: General   Post-op Pain Management:    Induction: Intravenous  Airway Management Planned:   Additional Equipment:   Intra-op Plan:   Post-operative Plan: Extubation in OR  Informed Consent: I have reviewed the patients History and Physical, chart, labs and discussed the procedure including the risks, benefits and alternatives for the proposed anesthesia with the patient or authorized representative who has indicated his/her understanding and acceptance.   Dental advisory given  Plan Discussed with: CRNA, Anesthesiologist and Surgeon  Anesthesia Plan Comments:         Anesthesia Quick Evaluation

## 2012-01-28 NOTE — Transfer of Care (Signed)
Immediate Anesthesia Transfer of Care Note  Patient: Jeffrey Fitzpatrick  Procedure(s) Performed: Procedure(s) (LRB): HERNIA REPAIR INGUINAL ADULT (Left) INSERTION OF MESH (N/A)  Patient Location: PACU  Anesthesia Type: General  Level of Consciousness: sedated, patient cooperative and responds to stimulaton  Airway & Oxygen Therapy: Patient Spontanous Breathing and Patient connected to face mask oxgen  Post-op Assessment: Report given to PACU RN and Post -op Vital signs reviewed and stable  Post vital signs: Reviewed and stable  Complications: No apparent anesthesia complications

## 2012-01-31 ENCOUNTER — Encounter (HOSPITAL_COMMUNITY): Payer: Self-pay | Admitting: Surgery

## 2012-01-31 ENCOUNTER — Telehealth (INDEPENDENT_AMBULATORY_CARE_PROVIDER_SITE_OTHER): Payer: Self-pay

## 2012-01-31 NOTE — Telephone Encounter (Signed)
Pts wife called stating pt has "cellulitis" everywhere the surgical drape/tape touched pts skin. She states skin is starting to blister. I reviewed concern with Dr Magnus Ivan. Per Dr Eliberto Ivory order Keflex 500mg  #15 one po tid for 5 days called to [facility] [phone]. Pt's wife advised.

## 2012-02-02 ENCOUNTER — Ambulatory Visit (INDEPENDENT_AMBULATORY_CARE_PROVIDER_SITE_OTHER): Payer: Medicare Other | Admitting: Surgery

## 2012-02-02 ENCOUNTER — Encounter (INDEPENDENT_AMBULATORY_CARE_PROVIDER_SITE_OTHER): Payer: Self-pay | Admitting: Surgery

## 2012-02-02 VITALS — BP 180/104 | HR 78 | Temp 98.2°F | Resp 14 | Ht 71.0 in | Wt 270.0 lb

## 2012-02-02 DIAGNOSIS — Z9889 Other specified postprocedural states: Secondary | ICD-10-CM

## 2012-02-02 MED ORDER — DOXYCYCLINE HYCLATE 100 MG PO TABS: 100.0000 mg | ORAL_TABLET | Freq: Two times a day (BID) | ORAL | Status: AC

## 2012-02-02 NOTE — Progress Notes (Signed)
Chief complaint: Possible wound infection  History of present illness: This patient had a left inguinal hernia repaired with mesh by Dr. Abigail Miyamoto about five or six days ago. Over the weekend he noticed some redness of the incision and spoke with our on-call doctor and was begun on Keflex 500 t.i.d. He notes that since then he has continued to have increasing redness around the incision itself. He has had some swelling of his left testicle. His had bruising or redness of the left inner thigh area around the inguinal crease. He notes that the entire incision is firm. He has not had any fevers.  He also notes that when he had other surgeries over the last ureter two he has had problems with his incision turning red and that they have thought this was due to some form of adhesive done during surgery.  Exam: Vital signs: Gen.: The patient is alert oriented healthy appearing and in no acute distress Incision: He has some redness involving the incision itself. It extends less than 1 cm superior to the incision and about 1 cm inferior to it in about 4 cm medial to the end of the incision. There is some bruising of the inner thigh area and scrotal area. There does not appear fluctuant. It is firm consistent with postoperative changes. There does not appear to be a deep wound infection. Its only slightly tender.  Impression: Possible superficial wound infection although this looks more like some form of localized reaction and I wonder if it is from the subcuticular suture since she's had this with other surgeries.  Plan: He is not improved on Keflex and he does have mesh in situ so I think it is appropriate to continue some antibiotics. I'm going to switch him to doxycycline 500 p.o. b.i.d. He will check back with [place] on Friday by telephone to let us know this area is getting better worse or staying the same.

## 2012-02-17 ENCOUNTER — Encounter (INDEPENDENT_AMBULATORY_CARE_PROVIDER_SITE_OTHER): Payer: Self-pay | Admitting: Surgery

## 2012-02-17 ENCOUNTER — Ambulatory Visit (INDEPENDENT_AMBULATORY_CARE_PROVIDER_SITE_OTHER): Payer: Medicare Other | Admitting: Surgery

## 2012-02-17 VITALS — BP 162/84 | HR 78 | Temp 97.8°F | Resp 14 | Ht 71.0 in | Wt 271.0 lb

## 2012-02-17 DIAGNOSIS — Z09 Encounter for follow-up examination after completed treatment for conditions other than malignant neoplasm: Secondary | ICD-10-CM

## 2012-02-17 NOTE — Progress Notes (Signed)
Subjective:     Patient ID: Jeffrey Fitzpatrick, male   DOB: Aug 01, 1951, 61 y.o.   MRN: [id number]  HPI He is here for his first postop visit status post left inguinal hernia  Mesh. He feels like he has had a yeast infection. He otherwise is without complaints except for mild postoperative discomfort  Review of Systems     Objective:   Physical Exam There does appear to be a slight yeast infection of the scrotum incision. Otherwise there is a small postoperative swelling with no evidence of recurrence    Assessment:     Left renal hernia with mesh doing well postop    Plan:     He will refrain from heavy lifting for one more week. I will write him for Diflucan and renew his hydrocodone.  I will see him back as needed

## 2012-02-24 ENCOUNTER — Telehealth (INDEPENDENT_AMBULATORY_CARE_PROVIDER_SITE_OTHER): Payer: Self-pay | Admitting: General Surgery

## 2012-02-24 NOTE — Telephone Encounter (Signed)
Wife calling to ask for refill on Diflucan.  Husband has been on multiple rounds of antibiotics, and one Rx of Diflucan, helped but did not clear the infection completely.  Please call to either [facility]:  [phone] or [facility]:  [phone].

## 2012-06-07 ENCOUNTER — Emergency Department (HOSPITAL_COMMUNITY): Payer: Medicare Other

## 2012-06-07 ENCOUNTER — Encounter (HOSPITAL_COMMUNITY): Payer: Self-pay | Admitting: Adult Health

## 2012-06-07 ENCOUNTER — Emergency Department (HOSPITAL_COMMUNITY)
Admission: EM | Admit: 2012-06-07 | Discharge: 2012-06-07 | Disposition: A | Discharge status: Home / Self Care | Payer: Medicare Other | Attending: Emergency Medicine | Admitting: Emergency Medicine

## 2012-06-07 DIAGNOSIS — M5430 Sciatica, unspecified side: Secondary | ICD-10-CM | POA: Insufficient documentation

## 2012-06-07 DIAGNOSIS — K4090 Unilateral inguinal hernia, without obstruction or gangrene, not specified as recurrent: Secondary | ICD-10-CM | POA: Insufficient documentation

## 2012-06-07 DIAGNOSIS — Z79899 Other long term (current) drug therapy: Secondary | ICD-10-CM | POA: Insufficient documentation

## 2012-06-07 DIAGNOSIS — Z7982 Long term (current) use of aspirin: Secondary | ICD-10-CM | POA: Insufficient documentation

## 2012-06-07 DIAGNOSIS — I10 Essential (primary) hypertension: Secondary | ICD-10-CM | POA: Insufficient documentation

## 2012-06-07 HISTORY — DX: Sciatica, unspecified side: M54.30

## 2012-06-07 MED ORDER — OXYCODONE-ACETAMINOPHEN 5-325 MG PO TABS: 2.0000 | ORAL_TABLET | ORAL | Status: DC | PRN

## 2012-06-07 MED ORDER — IBUPROFEN 400 MG PO TABS
800.0000 mg | ORAL_TABLET | Freq: Once | ORAL | Status: AC
  Administered 2012-06-07: 800 mg via ORAL
  Filled 2012-06-07: qty 1

## 2012-06-07 MED ORDER — OXYCODONE-ACETAMINOPHEN 5-325 MG PO TABS
2.0000 | ORAL_TABLET | Freq: Once | ORAL | Status: AC
  Administered 2012-06-07: 2 via ORAL
  Filled 2012-06-07: qty 2

## 2012-06-07 MED ORDER — IBUPROFEN 800 MG PO TABS: 800.0000 mg | ORAL_TABLET | Freq: Three times a day (TID) | ORAL | Status: DC

## 2012-06-07 MED ORDER — PREDNISONE 20 MG PO TABS
60.0000 mg | ORAL_TABLET | Freq: Once | ORAL | Status: AC
  Administered 2012-06-07: 60 mg via ORAL
  Filled 2012-06-07: qty 3

## 2012-06-07 MED ORDER — METHYLPREDNISOLONE 4 MG PO KIT: PACK | ORAL | Status: DC

## 2012-06-07 NOTE — ED Notes (Signed)
Ambulatory to bathroom without difficulty.   

## 2012-06-07 NOTE — ED Notes (Signed)
Reports 2 days of lowewr back pain radiating down left leg came on suddenely, HX of same. Hydrocodone makes pain better, leaning forward help ease pain. Leaning back makes pain worse. Able to ambulate.

## 2012-06-07 NOTE — ED Provider Notes (Signed)
History  This chart was scribed for Jeffrey Octave, MD by [name]. This patient was seen in room TR09C/TR09C and the patient's care was started at 1658.  CSN: [id number]  Arrival date & time 06/07/12  1544   First MD Initiated Contact with Patient 06/07/12 1658      Chief Complaint  Patient presents with  . Back Pain     The history is provided by the patient. No language interpreter was used.    Jeffrey Fitzpatrick is a 61 y.o. male who presents to the Emergency Department complaining of lower back pain that radiates down to the left leg that worsened 3 days ago. He is able to ambulate, but currently cannot stand for a long period of time with out severe pain. His pain is improved with leaning forward. He has associated nausea and weakness. He states he was working in the back yard building a tree house for his grand children recently. Pt denies visual disturbance, CP, cough, SOB, abdominal pain, emesis, diarrhea, urinary symptoms, HA, numbness and rash as associated symptoms.  He denies urinary and bowel incontinence. He denies any numbness or tingling in his legs. He has taken ibuprofen for the pain with a partial relief of symptoms. Pt reports a h/o sciatica, spinal stenosis and back surgery. He has a h/o of HTN, and inguinal hernia and hypothyroidism. He had a fever of 106 last week, but denies any tick exposure and feels fine now.    PCP: Dr. Aida Puffer Back Surgeon: Dr. Dutch Quint  Past Medical History  Diagnosis Date  . Hypertension     STRESS TEST OVER 4 YRS. NO CARDIAC MD  . Inguinal hernia     left  . Hypothyroidism   . Sciatica     Past Surgical History  Procedure Date  . Back surgery     2 LUMBAR SURGERIERS  . Neck surgery     X 2  . Lumbar laminectomy/decompression microdiscectomy 07/27/2011    Procedure: LUMBAR LAMINECTOMY/DECOMPRESSION MICRODISCECTOMY;  Surgeon: Kathaleen Maser Pool;  Location: [facility];  Service: Neurosurgery;  Laterality: N/A;  Lumbar Two-Three,  Lumbar Three-Four Decompressive Lumbar Laminectomy   . Inguinal hernia repair 01/28/2012    Procedure: HERNIA REPAIR INGUINAL ADULT;  Surgeon: Shelly Rubenstein, MD;  Location: [facility];  Service: General;  Laterality: Left;  Left Ingunial Hernia Repair with Mesh  . Hernia repair 01/28/2012    [facility]    Family History  Problem Relation Age of Onset  . Heart disease Father     History  Substance Use Topics  . Smoking status: Former Smoker    Types: Cigarettes  . Smokeless tobacco: Not on file  . Alcohol Use: No      Review of Systems  A complete 10 system review of systems was obtained and all systems are negative except as noted in the HPI and PMH.    Allergies  Adhesive; Benzoin; and Betadine  Home Medications   Current Outpatient Rx  Name Route Sig Dispense Refill  . AMLODIPINE BESYLATE 5 MG PO TABS Oral Take 5 mg by mouth daily with breakfast.     . ASPIRIN EC 81 MG PO TBEC Oral Take 81 mg by mouth daily with breakfast.     . HYDROCODONE-ACETAMINOPHEN 5-325 MG PO TABS Oral Take 1 tablet by mouth daily as needed. For pain    . IBUPROFEN 200 MG PO TABS Oral Take 800 mg by mouth daily as needed. For pain    .  LEVOTHYROXINE SODIUM 100 MCG PO TABS Oral Take 100 mcg by mouth daily with breakfast.     . ONE-DAILY MULTI VITAMINS PO TABS Oral Take 1 tablet by mouth daily with breakfast.     . VALSARTAN 160 MG PO TABS Oral Take 160 mg by mouth daily.       Triage Vitals: BP 168/72  Pulse 62  Temp 98.3 F (36.8 C) (Oral)  Resp 16  SpO2 96%  Physical Exam  Nursing note and vitals reviewed. Constitutional: He is oriented to person, place, and time. He appears well-developed and well-nourished. No distress.  HENT:  Head: Normocephalic and atraumatic.  Eyes: EOM are normal. Pupils are equal, round, and reactive to light.  Neck: Normal range of motion. Neck supple. No tracheal deviation present.  Cardiovascular: Normal rate, regular rhythm and normal heart sounds.     Pulmonary/Chest: Effort normal and breath sounds normal. No respiratory distress. He has no wheezes.  Abdominal: Soft. Bowel sounds are normal. He exhibits no mass. There is no tenderness.  Musculoskeletal: Normal range of motion.       5/5 strength in bilateral lower extremities. Ankle plantar and dorsiflexion intact. Great toe extension intact bilaterally. +2 DP and PT pulses. +2 patellar reflexes bilaterally. Normal gait. No pulsatile masses.     Neurological: He is alert and oriented to person, place, and time.       2-12 cranial nerve intact, normal gait  Skin: Skin is warm and dry.       Well healed lumbar scar  Psychiatric: He has a normal mood and affect. His behavior is normal.    ED Course  Procedures (including critical care time)  DIAGNOSTIC STUDIES: Oxygen Saturation is 94% on room air, adequate by my interpretation.    COORDINATION OF CARE:  1700- Discussed treatment plan with pt at bedside and pt agreed to plan. A CT of his abdomen and pelvis were ordered.    17:15-Medication Orders: Oxycodone-acetaminophen (Percocet/Roxicet) 5-325 mg per tablet 2 tablet-once: Prednisone (Deltasone) tablet 60 mg-once; Ibuprofen (Advil, Motrin) tablet 800 mg-once  18:58-Recheck: Informed pt of CT results. Will d/c with pain medication, steroids, and anti-inflammatories. Discussed f/u with PCP if symptoms don't improve. Pt is agreeable with plan.   Labs Reviewed - No data to display Ct Abdomen Pelvis Wo Contrast  06/07/2012  *RADIOLOGY REPORT*  Clinical Data: Left flank and back pain, pain radiating down leg, fever, weakness  CT ABDOMEN AND PELVIS WITHOUT CONTRAST  Technique:  Multidetector CT imaging of the abdomen and pelvis was performed following the standard protocol without intravenous contrast.   Sagittal and coronal MPR images reconstructed from axial data set.  Comparison: None.  Findings: Lung bases clear. Minimal atherosclerotic calcification aorta. No urinary tract  calcification, hydronephrosis or ureteral dilatation. Within limits of a nonenhanced exam no focal abnormalities of the liver, spleen, pancreas, kidneys, or adrenal glands identified. Normal appendix. Left inguinal hernia containing fat. Stranding is identified in the left inguinal region suggesting prior trauma or surgery. No bowel herniation identified.  Mild prostatic enlargement with unremarkable bladder. Stomach and bowel loops unremarkable. Scattered normal-sized retroperitoneal lymph nodes. No mass, adenopathy, free fluid or inflammatory process. Prior lumbar laminectomies at L2-L3 with multilevel degenerative disc and facet disease changes of lumbar spine. Bilateral neural foraminal stenoses at L2-L3, L4-L5, less L3-L4. Schmorl's nodes at L1 and L4 with vacuum phenomenon at the L1-L2 and L3-L4 discs.  IMPRESSION: Left inguinal hernia with question prior left inguinal hernia repair. Prior lumbar spine surgery at  L2-L3 with bilateral neural foraminal stenoses at L2-L3, L4-L5, less L3-L4. Mild prostatic enlargement.   Original Report Authenticated By: Lollie Marrow, M.D.      No diagnosis found.    MDM  History of lumbar laminectomy, spinal stenosis presenting with left-sided lower back pain for the past 2 days. Noticed it after building a tree house in the back yard. No weakness, numbness, tingling, bowel or bladder incontinence. No history of cancer or IV drug abuse. Better with bending forward.  Pain improved with medications.  No red flags on exam.  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 No evidence of cauda equina. Symptomatic treatment and followup with Dr. Jordan Likes.   I personally performed the services described in this documentation, which was scribed in my presence.  The recorded information has been reviewed and considered.       Jeffrey Octave, MD 06/07/12 Windell Moment

## 2013-02-08 ENCOUNTER — Other Ambulatory Visit: Payer: Self-pay

## 2013-02-08 MED ORDER — VALSARTAN 160 MG PO TABS: 160.0000 mg | ORAL_TABLET | Freq: Every day | ORAL | Status: DC

## 2013-09-05 ENCOUNTER — Other Ambulatory Visit: Payer: Self-pay

## 2013-09-05 MED ORDER — VALSARTAN 160 MG PO TABS: 160.0000 mg | ORAL_TABLET | Freq: Every day | ORAL | Status: DC

## 2013-11-12 ENCOUNTER — Other Ambulatory Visit: Payer: Self-pay

## 2013-11-12 MED ORDER — VALSARTAN 160 MG PO TABS: 160.0000 mg | ORAL_TABLET | Freq: Every day | ORAL | Status: DC

## 2014-02-07 ENCOUNTER — Other Ambulatory Visit: Payer: Self-pay

## 2014-02-07 MED ORDER — VALSARTAN 160 MG PO TABS: 160.0000 mg | ORAL_TABLET | Freq: Every day | ORAL | Status: AC

## 2014-06-05 ENCOUNTER — Other Ambulatory Visit: Payer: Self-pay | Admitting: Internal Medicine

## 2014-06-05 ENCOUNTER — Encounter: Payer: Self-pay | Admitting: Internal Medicine

## 2014-06-05 DIAGNOSIS — R319 Hematuria, unspecified: Secondary | ICD-10-CM

## 2014-06-05 DIAGNOSIS — R109 Unspecified abdominal pain: Secondary | ICD-10-CM

## 2014-06-06 ENCOUNTER — Ambulatory Visit
Admission: RE | Admit: 2014-06-06 | Discharge: 2014-06-06 | Disposition: A | Discharge status: Home / Self Care | Payer: Medicare Other | Source: Ambulatory Visit | Attending: Internal Medicine | Admitting: Internal Medicine

## 2014-06-06 DIAGNOSIS — R109 Unspecified abdominal pain: Secondary | ICD-10-CM

## 2014-06-06 DIAGNOSIS — R319 Hematuria, unspecified: Secondary | ICD-10-CM

## 2014-07-24 ENCOUNTER — Ambulatory Visit (AMBULATORY_SURGERY_CENTER): Payer: Medicare Other | Admitting: *Deleted

## 2014-07-24 VITALS — Ht 71.5 in | Wt 279.0 lb

## 2014-07-24 DIAGNOSIS — Z1211 Encounter for screening for malignant neoplasm of colon: Secondary | ICD-10-CM

## 2014-07-24 MED ORDER — MOVIPREP 100 G PO SOLR: ORAL | Status: DC

## 2014-07-24 NOTE — Progress Notes (Signed)
Patient denies any allergies to eggs or soy. Patient denies any problems with anesthesia/sedation. Patient denies any oxygen use at home and does not take any diet/weight loss medications. EMMI education assisgned to patient on colonoscopy, this was explained and instructions given to patient. 

## 2014-08-07 ENCOUNTER — Ambulatory Visit (AMBULATORY_SURGERY_CENTER): Payer: Medicare Other | Admitting: Internal Medicine

## 2014-08-07 ENCOUNTER — Encounter: Payer: Self-pay | Admitting: Internal Medicine

## 2014-08-07 VITALS — BP 137/79 | HR 63 | Temp 97.7°F | Resp 20 | Ht 71.0 in | Wt 279.0 lb

## 2014-08-07 DIAGNOSIS — D122 Benign neoplasm of ascending colon: Secondary | ICD-10-CM

## 2014-08-07 DIAGNOSIS — D120 Benign neoplasm of cecum: Secondary | ICD-10-CM

## 2014-08-07 DIAGNOSIS — Z1211 Encounter for screening for malignant neoplasm of colon: Secondary | ICD-10-CM

## 2014-08-07 DIAGNOSIS — D123 Benign neoplasm of transverse colon: Secondary | ICD-10-CM

## 2014-08-07 MED ORDER — SODIUM CHLORIDE 0.9 % IV SOLN: 500.0000 mL | INTRAVENOUS | Status: DC

## 2014-08-07 NOTE — Op Note (Signed)
[place]  [address] [place], [id number]   COLONOSCOPY PROCEDURE REPORT  PATIENT: Jeffrey, Fitzpatrick  MR#: [id number] BIRTHDATE: 07-19-1951 , 62  yrs. old GENDER: male ENDOSCOPIST: Eustace Quail, MD REFERRED MH:[id number] Ardeth Perfect, M.D. PROCEDURE DATE:  08/07/2014 PROCEDURE:   Colonoscopy with snare polypectomy x 8 First Screening Colonoscopy - Avg.  risk and is 50 yrs.  old or older Yes.  Prior Negative Screening - Now for repeat screening. N/A  History of Adenoma - Now for follow-up colonoscopy & has been > or = to 3 yrs.  N/A  Polyps Removed Today? Yes. ASA CLASS:   Class II INDICATIONS:average risk for colorectal cancer. MEDICATIONS: Monitored anesthesia care and Propofol 400 mg IV DESCRIPTION OF PROCEDURE:   After the risks benefits and alternatives of the procedure were thoroughly explained, informed consent was obtained.  The digital rectal exam revealed no abnormalities of the rectum.   The LB [id number]  endoscope was introduced through the anus and advanced to the cecum, which was identified by both the appendix and ileocecal valve. No adverse events experienced.   The quality of the prep was excellent, using MoviPrep  The instrument was then slowly withdrawn as the colon was fully examined.  COLON FINDINGS: Eight polyps ranging between 3-26mm in size were found in the transverse colon (3), ascending colon (2), and at the cecum (3).  A polypectomy was performed with a cold snare.  The resection was complete, the polyp tissue was completely retrieved and sent to histology.   The examination was otherwise normal. Retroflexed views revealed internal hemorrhoids. The time to cecum=3 minutes 40 seconds.  Withdrawal time=25 minutes 58 seconds. The scope was withdrawn and the procedure completed. COMPLICATIONS: There were no immediate complications.  ENDOSCOPIC IMPRESSION: 1.   Eight polyps were found in the colon; polypectomy was performed with a cold  snare 2.   The examination was otherwise normal  RECOMMENDATIONS: 1. Repeat Colonoscopy in 3 years.  eSigned:  Eustace Quail, MD 08/07/2014 11:10 AM   cc: The Patient    ; Velna Hatchet, MD   PATIENT NAME:  Jeffrey, Fitzpatrick MR#: [id number]

## 2014-08-07 NOTE — Progress Notes (Signed)
Called to room to assist during endoscopic procedure.  Patient ID and intended procedure confirmed with present staff. Received instructions for my participation in the procedure from the performing physician.  

## 2014-08-07 NOTE — Patient Instructions (Signed)

## 2014-08-07 NOTE — Progress Notes (Signed)
Report to rn. Patient awakening, vss

## 2014-08-12 ENCOUNTER — Telehealth: Payer: Self-pay | Admitting: *Deleted

## 2014-08-12 NOTE — Telephone Encounter (Signed)
  Follow up Call-  Call back number 08/07/2014  Post procedure Call Back phone  # [phone]  Permission to leave phone message Yes     Patient questions:  Do you have a fever, pain , or abdominal swelling? No. Pain Score  0 *  Have you tolerated food without any problems? Yes.    Have you been able to return to your normal activities? Yes.    Do you have any questions about your discharge instructions: Diet   No. Medications  No. Follow up visit  No.  Do you have questions or concerns about your Care? No.  Actions: * If pain score is 4 or above: No action needed, pain <4.

## 2014-08-14 ENCOUNTER — Encounter: Payer: Self-pay | Admitting: Internal Medicine

## 2014-08-23 ENCOUNTER — Encounter: Payer: Self-pay | Admitting: Neurology

## 2014-09-25 ENCOUNTER — Encounter: Payer: Self-pay | Admitting: Neurology

## 2014-09-26 ENCOUNTER — Encounter: Payer: Self-pay | Admitting: Neurology

## 2014-09-26 ENCOUNTER — Ambulatory Visit (INDEPENDENT_AMBULATORY_CARE_PROVIDER_SITE_OTHER): Payer: Medicare Other | Admitting: Neurology

## 2014-09-26 VITALS — BP 158/74 | HR 72 | Temp 97.0°F | Resp 12 | Ht 71.0 in | Wt 273.0 lb

## 2014-09-26 DIAGNOSIS — G4733 Obstructive sleep apnea (adult) (pediatric): Secondary | ICD-10-CM

## 2014-09-26 DIAGNOSIS — R519 Headache, unspecified: Secondary | ICD-10-CM

## 2014-09-26 DIAGNOSIS — R351 Nocturia: Secondary | ICD-10-CM

## 2014-09-26 DIAGNOSIS — R51 Headache: Secondary | ICD-10-CM

## 2014-09-26 DIAGNOSIS — G4719 Other hypersomnia: Secondary | ICD-10-CM

## 2014-09-26 DIAGNOSIS — E669 Obesity, unspecified: Secondary | ICD-10-CM

## 2014-09-26 NOTE — Patient Instructions (Signed)
Based on your symptoms, your prior history and your exam I believe you are at risk for obstructive sleep apnea or OSA, and I think we should proceed with a sleep study to determine whether you do or do not have OSA and how severe it is. If you have more than mild OSA, I want you to consider treatment with CPAP. Please remember, the risks and ramifications of moderate to severe obstructive sleep apnea or OSA are: Cardiovascular disease, including congestive heart failure, stroke, difficult to control hypertension, arrhythmias, and even type 2 diabetes has been linked to untreated OSA. Sleep apnea causes disruption of sleep and sleep deprivation in most cases, which, in turn, can cause recurrent headaches, problems with memory, mood, concentration, focus, and vigilance. Most people with untreated sleep apnea report excessive daytime sleepiness, which can affect their ability to drive. Please do not drive if you feel sleepy.  I will see you back after your sleep study to go over the test results and where to go from there. We will call you after your sleep study and to set up an appointment at the time.
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
## 2014-10-21 ENCOUNTER — Ambulatory Visit (INDEPENDENT_AMBULATORY_CARE_PROVIDER_SITE_OTHER): Payer: Medicare Other | Admitting: Neurology

## 2014-10-21 VITALS — BP 149/83

## 2014-10-21 DIAGNOSIS — G4731 Primary central sleep apnea: Secondary | ICD-10-CM

## 2014-10-21 DIAGNOSIS — G4733 Obstructive sleep apnea (adult) (pediatric): Secondary | ICD-10-CM

## 2014-10-21 DIAGNOSIS — G479 Sleep disorder, unspecified: Secondary | ICD-10-CM

## 2014-10-21 DIAGNOSIS — G4739 Other sleep apnea: Secondary | ICD-10-CM

## 2014-10-21 DIAGNOSIS — G4730 Sleep apnea, unspecified: Secondary | ICD-10-CM

## 2014-10-21 DIAGNOSIS — G4710 Hypersomnia, unspecified: Secondary | ICD-10-CM

## 2014-10-21 NOTE — Sleep Study (Signed)
Please see the scanned sleep study interpretation located in the Procedure tab within the Chart Review section. 

## 2014-10-25 ENCOUNTER — Telehealth: Payer: Self-pay | Admitting: Neurology

## 2014-10-25 DIAGNOSIS — G4731 Primary central sleep apnea: Secondary | ICD-10-CM

## 2014-10-25 DIAGNOSIS — G4733 Obstructive sleep apnea (adult) (pediatric): Secondary | ICD-10-CM

## 2014-10-25 DIAGNOSIS — G4739 Other sleep apnea: Secondary | ICD-10-CM

## 2014-10-25 NOTE — Telephone Encounter (Signed)
Please call and notify the patient that the recent sleep study did confirm the diagnosis of obstructive sleep apnea and that I recommend treatment for this in the form of CPAP. However, during the recent split-night sleep study he did not do well with CPAP at the time. He had what we call central apneas which can be sometimes induced by trying CPAP. He may do eventually quite well with CPAP or BiPAP but at this time, we need to bring him back for separate sleep study for proper titration and mask fitting. Please explain to patient and arrange for a CPAP titration study. I have placed an order in the chart. Thanks, Star Age, MD, PhD [facility])

## 2014-10-29 ENCOUNTER — Encounter: Payer: Self-pay | Admitting: Neurology

## 2014-10-30 NOTE — Telephone Encounter (Signed)
I spoke with both Mr. & Ms. Kruer regarding his sleep study results.  I attempted to explain to them that Mr. Spease needed a 2nd study in order to find proper pressure settings.  I explained to them the difference between Central and Obstructive sleep as well as CPAP and BIPAP therapy.  Ms. Danielski could not understand why everything wasn't completed in one night.  They were concerned with the cost of a 2nd study as well.  They would not agree to schedule a 2nd night study and asked for Dr. Guadelupe Sabin phone number.  I informed the patient and his wife I would be happy to schedule an appointment with Dr. Rexene Alberts to review the results in person.  An appointment was scheduled for March 24th.  Dr. Velna Hatchet was faxed a copy of the results.

## 2014-12-05 ENCOUNTER — Telehealth: Payer: Self-pay | Admitting: Neurology

## 2014-12-05 ENCOUNTER — Ambulatory Visit: Payer: Medicare Other | Admitting: Neurology

## 2014-12-05 NOTE — Telephone Encounter (Signed)
Confirmed appointment on 12/06/14 at 9:30pm,CPAP Titration with patient's wife.

## 2014-12-05 NOTE — Telephone Encounter (Signed)
Confirmed appointment on 12/06/14 at 9:30pm split study with patient's wife.

## 2014-12-06 ENCOUNTER — Ambulatory Visit (INDEPENDENT_AMBULATORY_CARE_PROVIDER_SITE_OTHER): Payer: Medicare Other | Admitting: Neurology

## 2014-12-06 DIAGNOSIS — G4761 Periodic limb movement disorder: Secondary | ICD-10-CM

## 2014-12-06 DIAGNOSIS — G4733 Obstructive sleep apnea (adult) (pediatric): Secondary | ICD-10-CM | POA: Diagnosis not present

## 2014-12-06 DIAGNOSIS — G479 Sleep disorder, unspecified: Secondary | ICD-10-CM

## 2014-12-06 DIAGNOSIS — G4731 Primary central sleep apnea: Secondary | ICD-10-CM

## 2014-12-06 NOTE — Sleep Study (Signed)
Please see the scanned sleep study interpretation located in the Procedure tab within the Chart Review section. 

## 2014-12-12 NOTE — Sleep Study (Signed)
Please see the scanned sleep study interpretation located in the Procedure tab within the Chart Review section. 

## 2014-12-20 ENCOUNTER — Telehealth: Payer: Self-pay | Admitting: Neurology

## 2014-12-20 DIAGNOSIS — G4731 Primary central sleep apnea: Secondary | ICD-10-CM

## 2014-12-20 DIAGNOSIS — G4733 Obstructive sleep apnea (adult) (pediatric): Secondary | ICD-10-CM

## 2014-12-20 NOTE — Telephone Encounter (Signed)
Beverlee Nims: Please call and inform patient that I have entered an order for treatment with PAP. He did well during the latest sleep study with BiPAP. We will, therefore, arrange for a machine for home use through a DME (durable medical equipment) company of His choice; and I will see the patient back in follow-up in about 6 weeks. Please also explain to the patient that I will be looking out for compliance data downloaded from the machine, which can be done remotely through a modem at times or stored on an SD card in the back of the machine. At the time of the followup appointment we will discuss sleep study results and how it is going with PAP treatment at home. Please advise patient to bring His machine at the time of the visit; at least for the first visit, even though this is cumbersome. Bringing the machine for every visit after that may not be needed, but often helps for the first visit. Please also make sure, the patient has a follow-up appointment with me in about 9 weeks from the setup date, thanks.   David: pls process order and scan report, and route or fax results to PCP, thx  Star Age, MD, PhD [facility] ([place])

## 2014-12-23 ENCOUNTER — Encounter: Payer: Self-pay | Admitting: Neurology

## 2014-12-23 NOTE — Telephone Encounter (Signed)
Study entered into Epic, Dr. Velna Hatchet faxed results, Patient referred to [facility] for BIPAP set up.

## 2014-12-23 NOTE — Telephone Encounter (Signed)
I spoke with patient. He is agreeable to proceeding. I have scheduled f/u with Dr. Rexene Alberts for 6/6.

## 2015-01-27 ENCOUNTER — Encounter: Payer: Self-pay | Admitting: Neurology

## 2015-02-17 ENCOUNTER — Encounter: Payer: Self-pay | Admitting: Neurology

## 2015-02-17 ENCOUNTER — Ambulatory Visit (INDEPENDENT_AMBULATORY_CARE_PROVIDER_SITE_OTHER): Payer: Medicare Other | Admitting: Neurology

## 2015-02-17 VITALS — BP 162/80 | HR 80 | Resp 18 | Ht 71.0 in | Wt 276.0 lb

## 2015-02-17 DIAGNOSIS — Z9989 Dependence on other enabling machines and devices: Secondary | ICD-10-CM

## 2015-02-17 DIAGNOSIS — G4739 Other sleep apnea: Secondary | ICD-10-CM

## 2015-02-17 DIAGNOSIS — E669 Obesity, unspecified: Secondary | ICD-10-CM | POA: Diagnosis not present

## 2015-02-17 DIAGNOSIS — G4733 Obstructive sleep apnea (adult) (pediatric): Secondary | ICD-10-CM

## 2015-02-17 DIAGNOSIS — G4731 Primary central sleep apnea: Principal | ICD-10-CM

## 2015-02-17 NOTE — Patient Instructions (Addendum)
Please continue using your BiPAP regularly and try not to skip any nights. While your insurance requires that you use BiPAP at least 4 hours each night on 70% of the nights, I recommend, that you not skip any nights and use it throughout the night if you can. Getting used to PAP and staying with the treatment long term does take time and patience and discipline. Untreated obstructive sleep apnea when it is moderate to severe can have an adverse impact on cardiovascular health and raise her risk for heart disease, arrhythmias, hypertension, congestive heart failure, stroke and diabetes. Untreated obstructive sleep apnea causes sleep disruption, nonrestorative sleep, and sleep deprivation. This can have an impact on your day to day functioning and cause daytime sleepiness and impairment of cognitive function, memory loss, mood disturbance, and problems focussing. Using BiPAP regularly can improve these symptoms.  Please call us back in about a month for an updated compliance download. We will check on your latest compliance and call you back. I will see you back in a few months.
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
## 2015-03-31 ENCOUNTER — Telehealth: Payer: Self-pay | Admitting: Neurology

## 2015-03-31 NOTE — Telephone Encounter (Signed)
I printed off recent down load, please let me know what you would like me to tell patient.

## 2015-03-31 NOTE — Telephone Encounter (Signed)
Patient is calling about CPAP therapy.  He stated you would be checking his records to see what kind of progress he is making and call him back. Thanks!

## 2015-04-01 NOTE — Telephone Encounter (Signed)
I reviewed the patient's BiPAP compliance data from 03/01/2015 through 03/30/2015 which is a total of 30 days during which time he used his machine 26 days with percent used days greater than 4 hours at 67%, still indicating suboptimal compliance. As discussed, the patient needs to continue using BiPAP but not skip any nights. Please call patient to reinforce that he should continue to use his machine every night. Also his average usage has decreased to less than 4 hours for the average night. This was better before. The settings seem to be adequate but his air leak has increased from before. This could be an indicator that his mask needs replacement or it is not fitted snugly enough. He is encouraged to get in touch with his DME company which is [facility] to address the air leak.

## 2015-04-02 NOTE — Telephone Encounter (Signed)
I spoke to wife and patient. He is aware of advice below. They are traveling for the next couple of years (they have sold their house) and will try to find a DME company near them to try and get a new mask. They will call us with any further questions.

## 2015-11-17 ENCOUNTER — Ambulatory Visit: Payer: Medicare Other | Admitting: Neurology

## 2016-07-22 ENCOUNTER — Other Ambulatory Visit: Payer: Self-pay | Admitting: Orthopedic Surgery

## 2016-07-22 DIAGNOSIS — R609 Edema, unspecified: Secondary | ICD-10-CM

## 2016-07-22 DIAGNOSIS — R52 Pain, unspecified: Secondary | ICD-10-CM

## 2016-08-01 ENCOUNTER — Other Ambulatory Visit: Payer: Self-pay

## 2016-08-03 ENCOUNTER — Ambulatory Visit
Admission: RE | Admit: 2016-08-03 | Discharge: 2016-08-03 | Disposition: A | Discharge status: Home / Self Care | Payer: Medicare Other | Source: Ambulatory Visit | Attending: Orthopedic Surgery | Admitting: Orthopedic Surgery

## 2016-08-03 DIAGNOSIS — R52 Pain, unspecified: Secondary | ICD-10-CM

## 2016-08-03 DIAGNOSIS — R609 Edema, unspecified: Secondary | ICD-10-CM

## 2017-08-12 ENCOUNTER — Encounter: Payer: Self-pay | Admitting: Internal Medicine

## 2017-12-30 ENCOUNTER — Emergency Department (HOSPITAL_COMMUNITY): Payer: Medicare Other

## 2017-12-30 ENCOUNTER — Emergency Department (HOSPITAL_COMMUNITY)
Admission: EM | Admit: 2017-12-30 | Discharge: 2017-12-30 | Disposition: A | Discharge status: Home / Self Care | Payer: Medicare Other | Attending: Emergency Medicine | Admitting: Emergency Medicine

## 2017-12-30 ENCOUNTER — Other Ambulatory Visit: Payer: Self-pay

## 2017-12-30 ENCOUNTER — Encounter (HOSPITAL_COMMUNITY): Payer: Self-pay

## 2017-12-30 DIAGNOSIS — Z5321 Procedure and treatment not carried out due to patient leaving prior to being seen by health care provider: Secondary | ICD-10-CM | POA: Insufficient documentation

## 2017-12-30 DIAGNOSIS — R079 Chest pain, unspecified: Secondary | ICD-10-CM | POA: Diagnosis present

## 2017-12-30 LAB — BASIC METABOLIC PANEL
Anion gap: 11 (ref 5–15)
BUN: 18 mg/dL (ref 6–20)
CHLORIDE: 103 mmol/L (ref 101–111)
CO2: 26 mmol/L (ref 22–32)
CREATININE: 0.99 mg/dL (ref 0.61–1.24)
Calcium: 9.6 mg/dL (ref 8.9–10.3)
GFR calc Af Amer: >60 mL/min (ref >60)
GFR calc non Af Amer: >60 mL/min (ref >60)
GLUCOSE: 158 mg/dL — AB (ref 65–99)
POTASSIUM: 3.2 mmol/L — AB (ref 3.5–5.1)
Sodium: 140 mmol/L (ref 135–145)

## 2017-12-30 LAB — CBC
HEMATOCRIT: 37.9 % — AB (ref 39.0–52.0)
Hemoglobin: 13 g/dL (ref 13.0–17.0)
MCH: 29.5 pg (ref 26.0–34.0)
MCHC: 34.3 g/dL (ref 30.0–36.0)
MCV: 85.9 fL (ref 78.0–100.0)
PLATELETS: 288 10*3/uL (ref 150–400)
RBC: 4.41 MIL/uL (ref 4.22–5.81)
RDW: 13.6 % (ref 11.5–15.5)
WBC: 6.9 10*3/uL (ref 4.0–10.5)

## 2017-12-30 LAB — I-STAT TROPONIN, ED: Troponin i, poc: 0.01 ng/mL (ref 0.00–0.08)

## 2017-12-30 NOTE — ED Triage Notes (Signed)
Pt states chest pain and shortness of breath that began this morning. Pt denies cardiac hx. States he could feel the symptoms coming on the last couple of days. Skin warm and dry. No distress noted in triage.

## 2019-10-08 IMAGING — CR DG CHEST 2V
2 series · 2 of 2 positions shown · non-contrast
Comparison: 07/19/2011

CLINICAL DATA: Chest pain

EXAM:
CHEST - 2 VIEW

[chest pa]
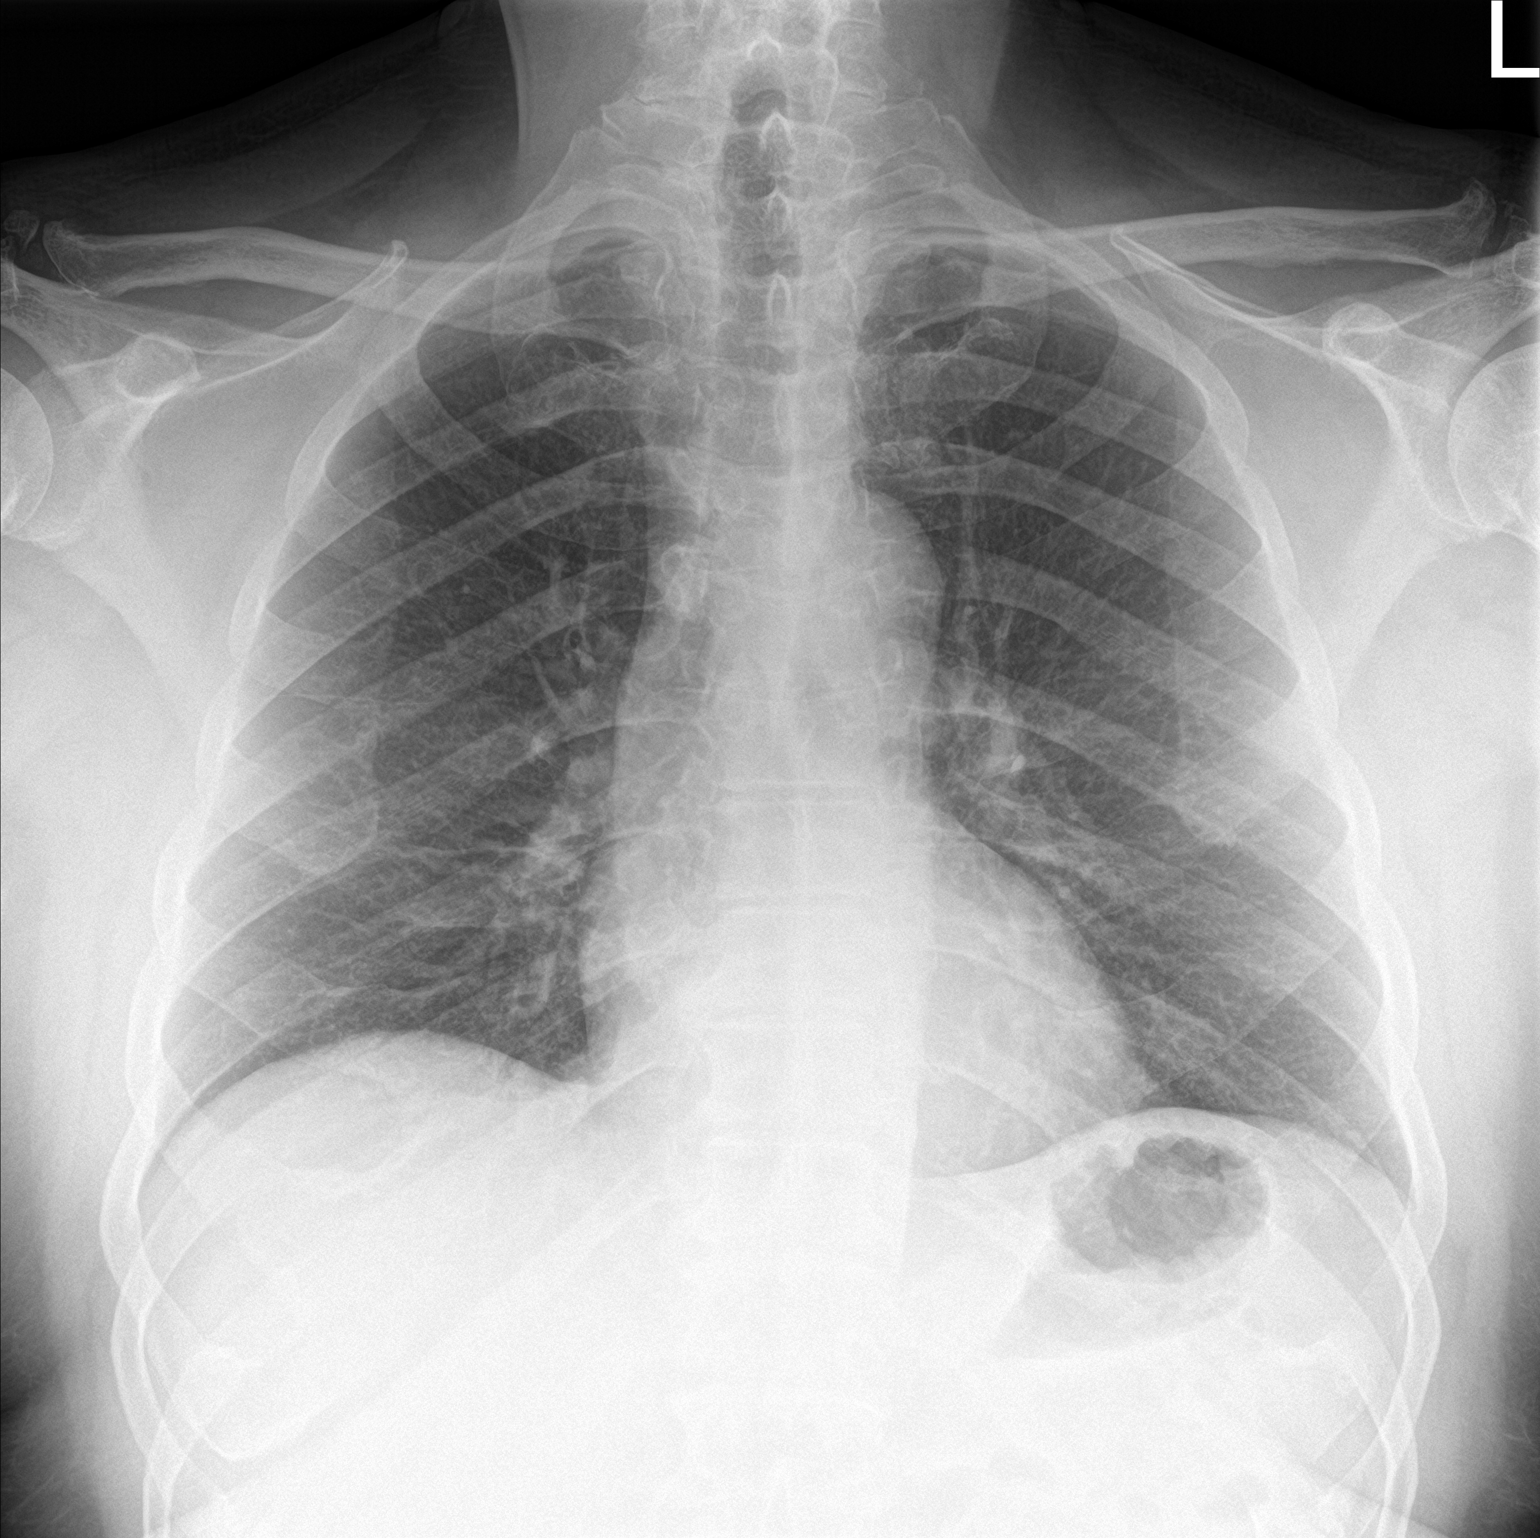

[chest lat]
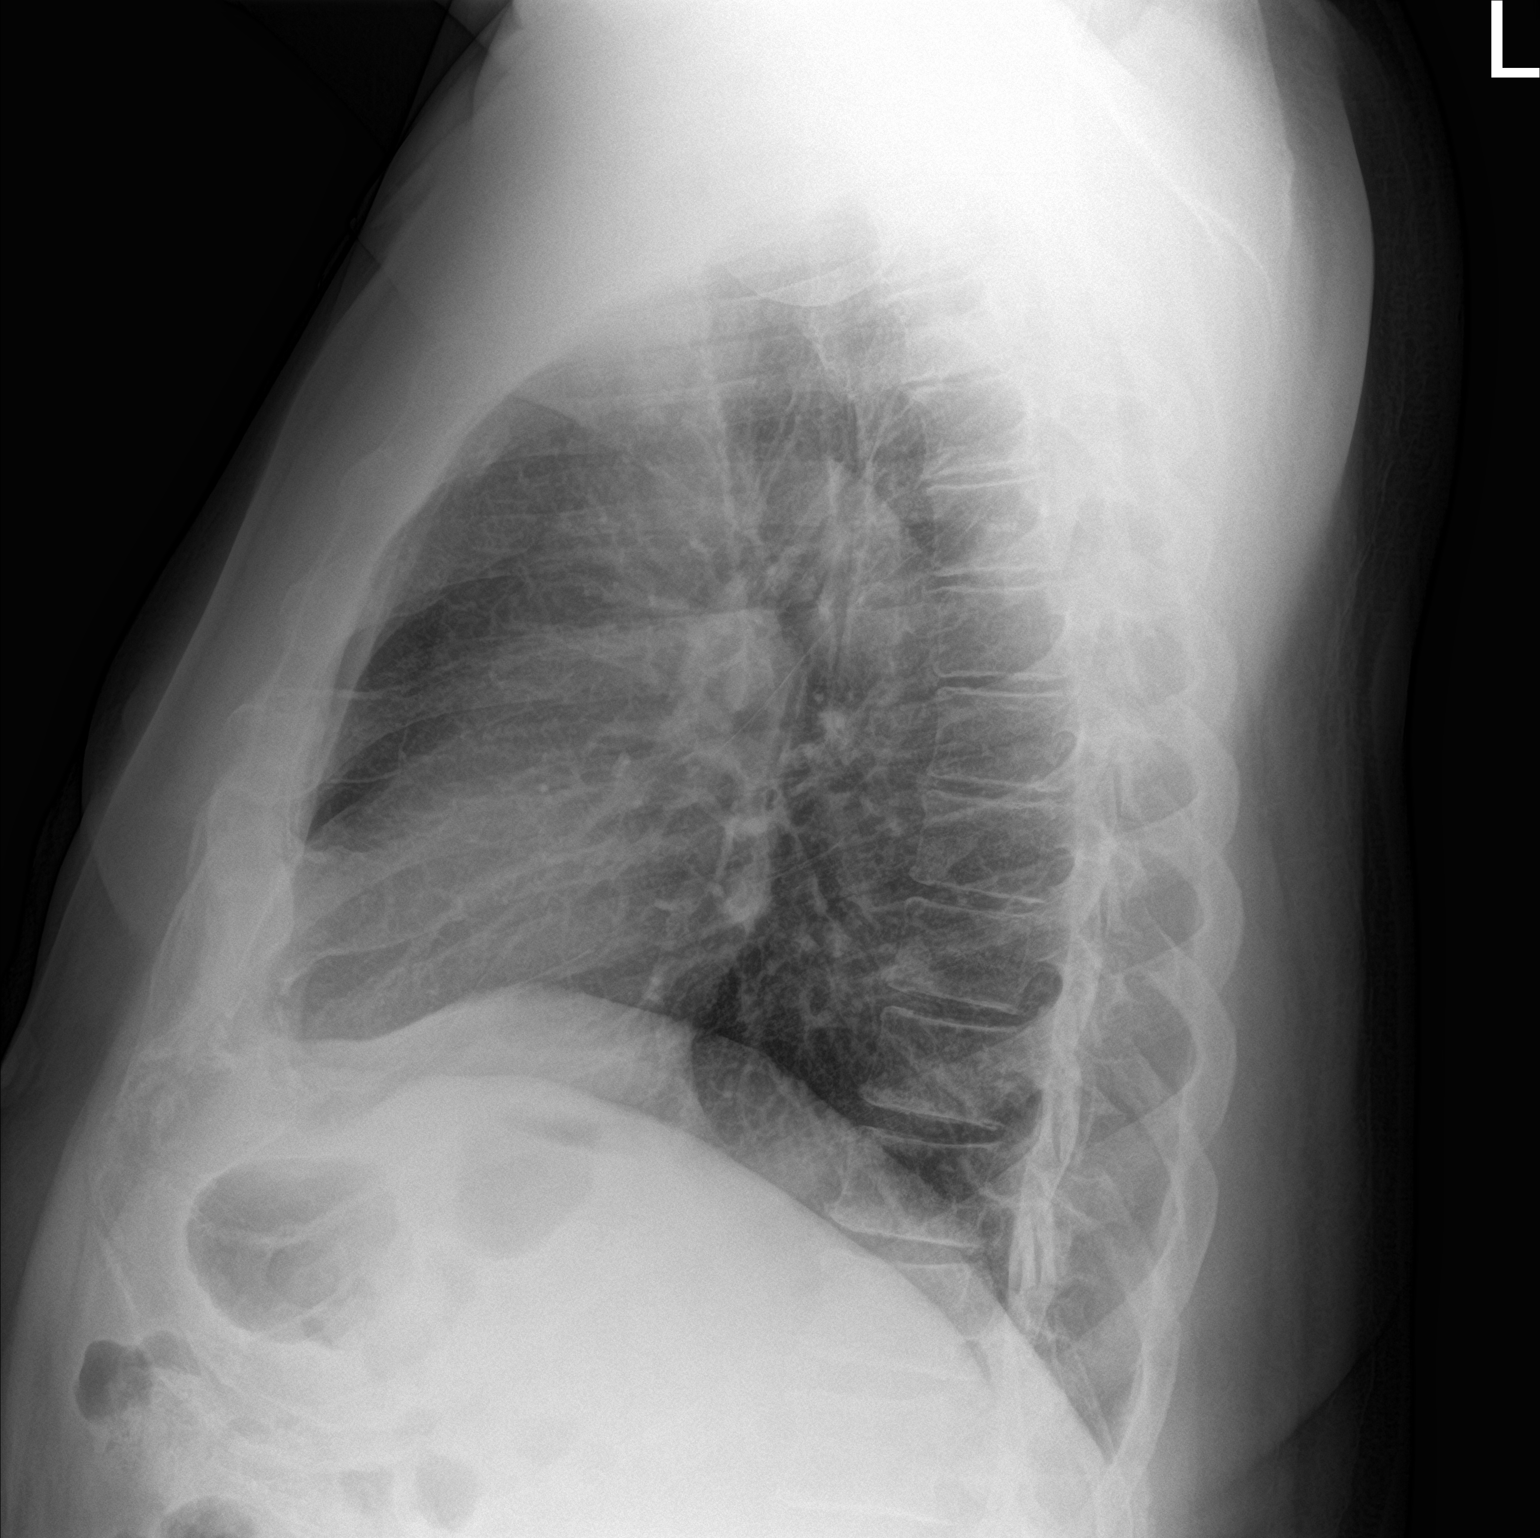

[2 of 2 positions shown; findings below may reference images not displayed]

FINDINGS: The heart size and mediastinal contours are within normal limits.
Both lungs are clear. The visualized skeletal structures are
unremarkable.
IMPRESSION: No active cardiopulmonary disease.

## 2020-09-04 DIAGNOSIS — M25512 Pain in left shoulder: Secondary | ICD-10-CM | POA: Diagnosis not present

## 2020-09-17 DIAGNOSIS — E291 Testicular hypofunction: Secondary | ICD-10-CM | POA: Diagnosis not present

## 2020-09-23 DIAGNOSIS — E785 Hyperlipidemia, unspecified: Secondary | ICD-10-CM | POA: Diagnosis not present

## 2020-09-23 DIAGNOSIS — E1165 Type 2 diabetes mellitus with hyperglycemia: Secondary | ICD-10-CM | POA: Diagnosis not present

## 2020-09-23 DIAGNOSIS — Z125 Encounter for screening for malignant neoplasm of prostate: Secondary | ICD-10-CM | POA: Diagnosis not present

## 2020-09-23 DIAGNOSIS — R82998 Other abnormal findings in urine: Secondary | ICD-10-CM | POA: Diagnosis not present

## 2020-09-23 DIAGNOSIS — E039 Hypothyroidism, unspecified: Secondary | ICD-10-CM | POA: Diagnosis not present

## 2020-09-24 DIAGNOSIS — E785 Hyperlipidemia, unspecified: Secondary | ICD-10-CM | POA: Diagnosis not present

## 2020-09-24 DIAGNOSIS — I10 Essential (primary) hypertension: Secondary | ICD-10-CM | POA: Diagnosis not present

## 2020-09-24 DIAGNOSIS — R3915 Urgency of urination: Secondary | ICD-10-CM | POA: Diagnosis not present

## 2020-09-24 DIAGNOSIS — Z0000 Encounter for general adult medical examination without abnormal findings: Secondary | ICD-10-CM | POA: Diagnosis not present

## 2020-09-24 DIAGNOSIS — Z1339 Encounter for screening examination for other mental health and behavioral disorders: Secondary | ICD-10-CM | POA: Diagnosis not present

## 2020-09-24 DIAGNOSIS — E039 Hypothyroidism, unspecified: Secondary | ICD-10-CM | POA: Diagnosis not present

## 2020-09-24 DIAGNOSIS — Z1331 Encounter for screening for depression: Secondary | ICD-10-CM | POA: Diagnosis not present

## 2020-09-24 DIAGNOSIS — G2581 Restless legs syndrome: Secondary | ICD-10-CM | POA: Diagnosis not present

## 2020-09-24 DIAGNOSIS — E1169 Type 2 diabetes mellitus with other specified complication: Secondary | ICD-10-CM | POA: Diagnosis not present

## 2021-02-19 DIAGNOSIS — M7552 Bursitis of left shoulder: Secondary | ICD-10-CM | POA: Diagnosis not present

## 2021-02-19 DIAGNOSIS — M67814 Other specified disorders of tendon, left shoulder: Secondary | ICD-10-CM | POA: Diagnosis not present

## 2021-09-25 DIAGNOSIS — E1165 Type 2 diabetes mellitus with hyperglycemia: Secondary | ICD-10-CM | POA: Diagnosis not present

## 2021-09-25 DIAGNOSIS — F5221 Male erectile disorder: Secondary | ICD-10-CM | POA: Diagnosis not present

## 2021-09-25 DIAGNOSIS — R6882 Decreased libido: Secondary | ICD-10-CM | POA: Diagnosis not present

## 2021-09-25 DIAGNOSIS — E785 Hyperlipidemia, unspecified: Secondary | ICD-10-CM | POA: Diagnosis not present

## 2021-09-25 DIAGNOSIS — I10 Essential (primary) hypertension: Secondary | ICD-10-CM | POA: Diagnosis not present

## 2021-09-25 DIAGNOSIS — K219 Gastro-esophageal reflux disease without esophagitis: Secondary | ICD-10-CM | POA: Diagnosis not present

## 2021-09-25 DIAGNOSIS — E039 Hypothyroidism, unspecified: Secondary | ICD-10-CM | POA: Diagnosis not present

## 2021-09-25 DIAGNOSIS — E1121 Type 2 diabetes mellitus with diabetic nephropathy: Secondary | ICD-10-CM | POA: Diagnosis not present

## 2021-09-25 DIAGNOSIS — Z0000 Encounter for general adult medical examination without abnormal findings: Secondary | ICD-10-CM | POA: Diagnosis not present

## 2021-10-02 DIAGNOSIS — Z1331 Encounter for screening for depression: Secondary | ICD-10-CM | POA: Diagnosis not present

## 2021-10-02 DIAGNOSIS — Z0000 Encounter for general adult medical examination without abnormal findings: Secondary | ICD-10-CM | POA: Diagnosis not present

## 2021-10-02 DIAGNOSIS — E1169 Type 2 diabetes mellitus with other specified complication: Secondary | ICD-10-CM | POA: Diagnosis not present

## 2021-10-02 DIAGNOSIS — N401 Enlarged prostate with lower urinary tract symptoms: Secondary | ICD-10-CM | POA: Diagnosis not present

## 2021-10-02 DIAGNOSIS — Z1339 Encounter for screening examination for other mental health and behavioral disorders: Secondary | ICD-10-CM | POA: Diagnosis not present

## 2021-10-02 DIAGNOSIS — I10 Essential (primary) hypertension: Secondary | ICD-10-CM | POA: Diagnosis not present

## 2021-10-02 DIAGNOSIS — E039 Hypothyroidism, unspecified: Secondary | ICD-10-CM | POA: Diagnosis not present

## 2021-10-02 DIAGNOSIS — R3915 Urgency of urination: Secondary | ICD-10-CM | POA: Diagnosis not present

## 2021-10-02 DIAGNOSIS — E785 Hyperlipidemia, unspecified: Secondary | ICD-10-CM | POA: Diagnosis not present

## 2021-11-10 DIAGNOSIS — M48061 Spinal stenosis, lumbar region without neurogenic claudication: Secondary | ICD-10-CM | POA: Diagnosis not present

## 2021-11-10 DIAGNOSIS — M21379 Foot drop, unspecified foot: Secondary | ICD-10-CM | POA: Diagnosis not present

## 2021-11-10 DIAGNOSIS — M5416 Radiculopathy, lumbar region: Secondary | ICD-10-CM | POA: Diagnosis not present

## 2021-11-20 DIAGNOSIS — M5126 Other intervertebral disc displacement, lumbar region: Secondary | ICD-10-CM | POA: Diagnosis not present

## 2021-11-20 DIAGNOSIS — M5416 Radiculopathy, lumbar region: Secondary | ICD-10-CM | POA: Diagnosis not present

## 2021-11-20 DIAGNOSIS — M47816 Spondylosis without myelopathy or radiculopathy, lumbar region: Secondary | ICD-10-CM | POA: Diagnosis not present

## 2021-11-26 DIAGNOSIS — M5416 Radiculopathy, lumbar region: Secondary | ICD-10-CM | POA: Diagnosis not present

## 2021-11-26 DIAGNOSIS — M21379 Foot drop, unspecified foot: Secondary | ICD-10-CM | POA: Diagnosis not present

## 2021-11-26 DIAGNOSIS — M48061 Spinal stenosis, lumbar region without neurogenic claudication: Secondary | ICD-10-CM | POA: Diagnosis not present

## 2021-12-11 DIAGNOSIS — M5417 Radiculopathy, lumbosacral region: Secondary | ICD-10-CM | POA: Diagnosis not present

## 2021-12-11 DIAGNOSIS — M47817 Spondylosis without myelopathy or radiculopathy, lumbosacral region: Secondary | ICD-10-CM | POA: Diagnosis not present

## 2021-12-31 DIAGNOSIS — I10 Essential (primary) hypertension: Secondary | ICD-10-CM | POA: Diagnosis not present

## 2021-12-31 DIAGNOSIS — Z7984 Long term (current) use of oral hypoglycemic drugs: Secondary | ICD-10-CM | POA: Diagnosis not present

## 2021-12-31 DIAGNOSIS — M47812 Spondylosis without myelopathy or radiculopathy, cervical region: Secondary | ICD-10-CM | POA: Diagnosis not present

## 2021-12-31 DIAGNOSIS — E119 Type 2 diabetes mellitus without complications: Secondary | ICD-10-CM | POA: Diagnosis not present

## 2021-12-31 DIAGNOSIS — M5481 Occipital neuralgia: Secondary | ICD-10-CM | POA: Diagnosis not present

## 2021-12-31 DIAGNOSIS — M542 Cervicalgia: Secondary | ICD-10-CM | POA: Diagnosis not present

## 2021-12-31 DIAGNOSIS — Z79890 Hormone replacement therapy (postmenopausal): Secondary | ICD-10-CM | POA: Diagnosis not present

## 2021-12-31 DIAGNOSIS — R519 Headache, unspecified: Secondary | ICD-10-CM | POA: Diagnosis not present

## 2022-01-05 DIAGNOSIS — M47812 Spondylosis without myelopathy or radiculopathy, cervical region: Secondary | ICD-10-CM | POA: Diagnosis not present

## 2022-01-05 DIAGNOSIS — M4802 Spinal stenosis, cervical region: Secondary | ICD-10-CM | POA: Diagnosis not present

## 2022-01-07 DIAGNOSIS — M21379 Foot drop, unspecified foot: Secondary | ICD-10-CM | POA: Diagnosis not present

## 2022-01-07 DIAGNOSIS — M4802 Spinal stenosis, cervical region: Secondary | ICD-10-CM | POA: Diagnosis not present

## 2022-01-07 DIAGNOSIS — M5412 Radiculopathy, cervical region: Secondary | ICD-10-CM | POA: Diagnosis not present

## 2022-01-07 DIAGNOSIS — M5416 Radiculopathy, lumbar region: Secondary | ICD-10-CM | POA: Diagnosis not present

## 2022-01-07 DIAGNOSIS — M48061 Spinal stenosis, lumbar region without neurogenic claudication: Secondary | ICD-10-CM | POA: Diagnosis not present

## 2022-01-13 DIAGNOSIS — Z01812 Encounter for preprocedural laboratory examination: Secondary | ICD-10-CM | POA: Diagnosis not present

## 2022-01-13 DIAGNOSIS — R9431 Abnormal electrocardiogram [ECG] [EKG]: Secondary | ICD-10-CM | POA: Diagnosis not present

## 2022-01-13 DIAGNOSIS — Z01810 Encounter for preprocedural cardiovascular examination: Secondary | ICD-10-CM | POA: Diagnosis not present

## 2022-01-13 DIAGNOSIS — Z118 Encounter for screening for other infectious and parasitic diseases: Secondary | ICD-10-CM | POA: Diagnosis not present

## 2022-01-13 DIAGNOSIS — J984 Other disorders of lung: Secondary | ICD-10-CM | POA: Diagnosis not present

## 2022-01-13 DIAGNOSIS — R52 Pain, unspecified: Secondary | ICD-10-CM | POA: Diagnosis not present

## 2022-01-13 DIAGNOSIS — Z01818 Encounter for other preprocedural examination: Secondary | ICD-10-CM | POA: Diagnosis not present

## 2022-01-13 DIAGNOSIS — Z0189 Encounter for other specified special examinations: Secondary | ICD-10-CM | POA: Diagnosis not present

## 2022-01-13 DIAGNOSIS — Z7901 Long term (current) use of anticoagulants: Secondary | ICD-10-CM | POA: Diagnosis not present

## 2022-01-14 ENCOUNTER — Ambulatory Visit: Payer: Medicare PPO | Admitting: Cardiovascular Disease

## 2022-01-14 ENCOUNTER — Telehealth: Payer: Self-pay

## 2022-01-14 ENCOUNTER — Encounter: Payer: Self-pay | Admitting: Cardiovascular Disease

## 2022-01-14 VITALS — BP 128/70 | HR 88 | Ht 71.0 in | Wt 260.8 lb

## 2022-01-14 DIAGNOSIS — I10 Essential (primary) hypertension: Secondary | ICD-10-CM | POA: Diagnosis not present

## 2022-01-14 DIAGNOSIS — R9431 Abnormal electrocardiogram [ECG] [EKG]: Secondary | ICD-10-CM | POA: Diagnosis not present

## 2022-01-14 DIAGNOSIS — Z01810 Encounter for preprocedural cardiovascular examination: Secondary | ICD-10-CM | POA: Diagnosis not present

## 2022-01-14 NOTE — Patient Instructions (Signed)
Medication Instructions:  ?Your physician recommends that you continue on your current medications as directed. Please refer to the Current Medication list given to you today. ? ?*If you need a refill on your cardiac medications before your next appointment, please call your pharmacy* ? ? ?Lab Work: ?NONE ?If you have labs (blood work) drawn today and your tests are completely normal, you will receive your results only by: ?MyChart Message (if you have MyChart) OR ?A paper copy in the mail ?If you have any lab test that is abnormal or we need to change your treatment, we will call you to review the results. ? ? ?Testing/Procedures: ?**Clear for surgery** (Will send notes and follow-up to Dr. Lorin Picket Massey's office) ? ?Follow-Up: ?At [facility], you and your health needs are our priority.  As part of our continuing mission to provide you with exceptional heart care, we have created designated Provider Care Teams.  These Care Teams include your primary Cardiologist (physician) and Advanced Practice Providers (APPs -  Physician Assistants and Nurse Practitioners) who all work together to provide you with the care you need, when you need it. ? ?Provider:   ?Sherren Mocha, MD ? ?  ? ?Important Information About [employer] ? ? ? ? ?  ?

## 2022-01-14 NOTE — Telephone Encounter (Addendum)
? ?  Pre-operative Risk Assessment  ?  ?Patient Name: Jeffrey Fitzpatrick  ?DOB: 1951/01/25 ?MRN: [id number]  ? ?  ? ?Request for Surgical Clearance   ? ?Procedure:   Laminectomy L4-L5; T12-L2 ? ?Date of Surgery:  Clearance TBD                              ?   ?Surgeon:  Dr Felton Clinton ?Surgeon's Group or Practice Name:  [facility] ?Phone number:  [phone] ?Fax number:  [phone] ?  ?Type of Clearance Requested:   ?- Medical  ?  ?Type of Anesthesia:  General  ?  ?Additional requests/questions:  Please fax a copy of office notes, stress test, echo, and EKG to the surgeon's office. ? ?Signed, ?Ulice Brilliant T   ?01/14/2022, 9:21 AM  ? ?
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
## 2022-01-14 NOTE — Telephone Encounter (Signed)
? ?  Name: Jeffrey Fitzpatrick  ?DOB: March 10, 1951  ?MRN: [id number] ? ?Primary Cardiologist: None ? ?Chart reviewed as part of pre-operative protocol coverage. Because of Jeffrey Fitzpatrick's past medical history and time since last visit, he will require a follow-up in-office visit in order to better assess preoperative cardiovascular risk. ? ?Pre-op covering staff: ?- Please schedule appointment and call patient to inform them. If patient already had an upcoming appointment within acceptable timeframe, please add "pre-op clearance" to the appointment notes so provider is aware. ?- Please contact requesting surgeon's office via preferred method (i.e, phone, fax) to inform them of need for appointment prior to surgery. ? ? ? ?Mable Fill, Marissa Nestle, NP  ?01/14/2022, 9:30 AM   ?

## 2022-01-14 NOTE — Telephone Encounter (Signed)
The patient has an appointment today with Dr Burt Knack. Clearance can be addressed at his appointment  ?

## 2022-01-21 DIAGNOSIS — M48061 Spinal stenosis, lumbar region without neurogenic claudication: Secondary | ICD-10-CM | POA: Diagnosis not present

## 2022-01-22 ENCOUNTER — Ambulatory Visit: Payer: Medicare Other | Admitting: Cardiovascular Disease

## 2022-01-25 DIAGNOSIS — M5416 Radiculopathy, lumbar region: Secondary | ICD-10-CM | POA: Diagnosis not present

## 2022-01-25 DIAGNOSIS — E039 Hypothyroidism, unspecified: Secondary | ICD-10-CM | POA: Diagnosis not present

## 2022-01-25 DIAGNOSIS — M48062 Spinal stenosis, lumbar region with neurogenic claudication: Secondary | ICD-10-CM | POA: Diagnosis not present

## 2022-01-25 DIAGNOSIS — M6788 Other specified disorders of synovium and tendon, other site: Secondary | ICD-10-CM | POA: Diagnosis not present

## 2022-01-25 DIAGNOSIS — E079 Disorder of thyroid, unspecified: Secondary | ICD-10-CM | POA: Diagnosis not present

## 2022-01-25 DIAGNOSIS — Z0489 Encounter for examination and observation for other specified reasons: Secondary | ICD-10-CM | POA: Diagnosis not present

## 2022-01-25 DIAGNOSIS — M4804 Spinal stenosis, thoracic region: Secondary | ICD-10-CM | POA: Diagnosis not present

## 2022-01-25 DIAGNOSIS — G4730 Sleep apnea, unspecified: Secondary | ICD-10-CM | POA: Diagnosis not present

## 2022-01-25 DIAGNOSIS — I10 Essential (primary) hypertension: Secondary | ICD-10-CM | POA: Diagnosis not present

## 2022-01-25 DIAGNOSIS — M21371 Foot drop, right foot: Secondary | ICD-10-CM | POA: Diagnosis not present

## 2022-01-25 DIAGNOSIS — M48061 Spinal stenosis, lumbar region without neurogenic claudication: Secondary | ICD-10-CM | POA: Diagnosis not present

## 2022-01-25 DIAGNOSIS — E119 Type 2 diabetes mellitus without complications: Secondary | ICD-10-CM | POA: Diagnosis not present

## 2022-01-25 DIAGNOSIS — M1990 Unspecified osteoarthritis, unspecified site: Secondary | ICD-10-CM | POA: Diagnosis not present

## 2022-01-26 DIAGNOSIS — G4730 Sleep apnea, unspecified: Secondary | ICD-10-CM | POA: Diagnosis not present

## 2022-01-26 DIAGNOSIS — M4804 Spinal stenosis, thoracic region: Secondary | ICD-10-CM | POA: Diagnosis not present

## 2022-01-26 DIAGNOSIS — I10 Essential (primary) hypertension: Secondary | ICD-10-CM | POA: Diagnosis not present

## 2022-01-26 DIAGNOSIS — M5416 Radiculopathy, lumbar region: Secondary | ICD-10-CM | POA: Diagnosis not present

## 2022-01-26 DIAGNOSIS — E119 Type 2 diabetes mellitus without complications: Secondary | ICD-10-CM | POA: Diagnosis not present

## 2022-01-26 DIAGNOSIS — M48061 Spinal stenosis, lumbar region without neurogenic claudication: Secondary | ICD-10-CM | POA: Diagnosis not present

## 2022-01-26 DIAGNOSIS — E079 Disorder of thyroid, unspecified: Secondary | ICD-10-CM | POA: Diagnosis not present

## 2022-01-26 DIAGNOSIS — M21371 Foot drop, right foot: Secondary | ICD-10-CM | POA: Diagnosis not present

## 2022-01-26 DIAGNOSIS — M1990 Unspecified osteoarthritis, unspecified site: Secondary | ICD-10-CM | POA: Diagnosis not present

## 2022-03-18 DIAGNOSIS — M5416 Radiculopathy, lumbar region: Secondary | ICD-10-CM | POA: Diagnosis not present

## 2022-03-18 DIAGNOSIS — Z4889 Encounter for other specified surgical aftercare: Secondary | ICD-10-CM | POA: Diagnosis not present

## 2022-03-18 DIAGNOSIS — M48061 Spinal stenosis, lumbar region without neurogenic claudication: Secondary | ICD-10-CM | POA: Diagnosis not present

## 2022-04-14 DIAGNOSIS — N401 Enlarged prostate with lower urinary tract symptoms: Secondary | ICD-10-CM | POA: Diagnosis not present

## 2022-04-14 DIAGNOSIS — G2581 Restless legs syndrome: Secondary | ICD-10-CM | POA: Diagnosis not present

## 2022-04-14 DIAGNOSIS — E785 Hyperlipidemia, unspecified: Secondary | ICD-10-CM | POA: Diagnosis not present

## 2022-04-14 DIAGNOSIS — I10 Essential (primary) hypertension: Secondary | ICD-10-CM | POA: Diagnosis not present

## 2022-04-14 DIAGNOSIS — E291 Testicular hypofunction: Secondary | ICD-10-CM | POA: Diagnosis not present

## 2022-04-14 DIAGNOSIS — K219 Gastro-esophageal reflux disease without esophagitis: Secondary | ICD-10-CM | POA: Diagnosis not present

## 2022-04-14 DIAGNOSIS — E1169 Type 2 diabetes mellitus with other specified complication: Secondary | ICD-10-CM | POA: Diagnosis not present

## 2022-04-14 DIAGNOSIS — R3915 Urgency of urination: Secondary | ICD-10-CM | POA: Diagnosis not present

## 2022-04-14 DIAGNOSIS — E039 Hypothyroidism, unspecified: Secondary | ICD-10-CM | POA: Diagnosis not present

## 2022-06-14 DIAGNOSIS — M67814 Other specified disorders of tendon, left shoulder: Secondary | ICD-10-CM | POA: Diagnosis not present

## 2022-06-14 DIAGNOSIS — M7552 Bursitis of left shoulder: Secondary | ICD-10-CM | POA: Diagnosis not present

## 2022-06-14 DIAGNOSIS — M47812 Spondylosis without myelopathy or radiculopathy, cervical region: Secondary | ICD-10-CM | POA: Diagnosis not present

## 2022-06-23 DIAGNOSIS — M47812 Spondylosis without myelopathy or radiculopathy, cervical region: Secondary | ICD-10-CM | POA: Diagnosis not present

## 2022-06-23 DIAGNOSIS — M5412 Radiculopathy, cervical region: Secondary | ICD-10-CM | POA: Diagnosis not present

## 2022-07-01 DIAGNOSIS — M21371 Foot drop, right foot: Secondary | ICD-10-CM | POA: Diagnosis not present

## 2022-07-01 DIAGNOSIS — Z9889 Other specified postprocedural states: Secondary | ICD-10-CM | POA: Diagnosis not present

## 2022-07-01 DIAGNOSIS — M549 Dorsalgia, unspecified: Secondary | ICD-10-CM | POA: Diagnosis not present

## 2022-10-11 DIAGNOSIS — I10 Essential (primary) hypertension: Secondary | ICD-10-CM | POA: Diagnosis not present

## 2022-10-11 DIAGNOSIS — E1169 Type 2 diabetes mellitus with other specified complication: Secondary | ICD-10-CM | POA: Diagnosis not present

## 2022-10-11 DIAGNOSIS — E785 Hyperlipidemia, unspecified: Secondary | ICD-10-CM | POA: Diagnosis not present

## 2022-10-11 DIAGNOSIS — Z0000 Encounter for general adult medical examination without abnormal findings: Secondary | ICD-10-CM | POA: Diagnosis not present

## 2022-10-11 DIAGNOSIS — E039 Hypothyroidism, unspecified: Secondary | ICD-10-CM | POA: Diagnosis not present

## 2022-10-11 DIAGNOSIS — E291 Testicular hypofunction: Secondary | ICD-10-CM | POA: Diagnosis not present

## 2022-10-15 DIAGNOSIS — N401 Enlarged prostate with lower urinary tract symptoms: Secondary | ICD-10-CM | POA: Diagnosis not present

## 2022-10-15 DIAGNOSIS — Z1339 Encounter for screening examination for other mental health and behavioral disorders: Secondary | ICD-10-CM | POA: Diagnosis not present

## 2022-10-15 DIAGNOSIS — Z0000 Encounter for general adult medical examination without abnormal findings: Secondary | ICD-10-CM | POA: Diagnosis not present

## 2022-10-15 DIAGNOSIS — Z1331 Encounter for screening for depression: Secondary | ICD-10-CM | POA: Diagnosis not present

## 2022-10-15 DIAGNOSIS — R3915 Urgency of urination: Secondary | ICD-10-CM | POA: Diagnosis not present

## 2022-10-15 DIAGNOSIS — E039 Hypothyroidism, unspecified: Secondary | ICD-10-CM | POA: Diagnosis not present

## 2022-10-15 DIAGNOSIS — G2581 Restless legs syndrome: Secondary | ICD-10-CM | POA: Diagnosis not present

## 2022-10-15 DIAGNOSIS — I10 Essential (primary) hypertension: Secondary | ICD-10-CM | POA: Diagnosis not present

## 2022-10-15 DIAGNOSIS — K219 Gastro-esophageal reflux disease without esophagitis: Secondary | ICD-10-CM | POA: Diagnosis not present

## 2022-10-15 DIAGNOSIS — E1169 Type 2 diabetes mellitus with other specified complication: Secondary | ICD-10-CM | POA: Diagnosis not present

## 2022-10-15 DIAGNOSIS — E785 Hyperlipidemia, unspecified: Secondary | ICD-10-CM | POA: Diagnosis not present

## 2022-11-08 DIAGNOSIS — M7552 Bursitis of left shoulder: Secondary | ICD-10-CM | POA: Diagnosis not present

## 2022-11-08 DIAGNOSIS — M67814 Other specified disorders of tendon, left shoulder: Secondary | ICD-10-CM | POA: Diagnosis not present

## 2023-03-31 DIAGNOSIS — M47812 Spondylosis without myelopathy or radiculopathy, cervical region: Secondary | ICD-10-CM | POA: Diagnosis not present

## 2023-04-08 DIAGNOSIS — M47812 Spondylosis without myelopathy or radiculopathy, cervical region: Secondary | ICD-10-CM | POA: Diagnosis not present

## 2023-04-15 DIAGNOSIS — I10 Essential (primary) hypertension: Secondary | ICD-10-CM | POA: Diagnosis not present

## 2023-04-15 DIAGNOSIS — E785 Hyperlipidemia, unspecified: Secondary | ICD-10-CM | POA: Diagnosis not present

## 2023-04-15 DIAGNOSIS — E1165 Type 2 diabetes mellitus with hyperglycemia: Secondary | ICD-10-CM | POA: Diagnosis not present

## 2023-04-15 DIAGNOSIS — E039 Hypothyroidism, unspecified: Secondary | ICD-10-CM | POA: Diagnosis not present

## 2023-04-15 DIAGNOSIS — E291 Testicular hypofunction: Secondary | ICD-10-CM | POA: Diagnosis not present

## 2023-04-15 DIAGNOSIS — Z0000 Encounter for general adult medical examination without abnormal findings: Secondary | ICD-10-CM | POA: Diagnosis not present

## 2023-04-20 DIAGNOSIS — N401 Enlarged prostate with lower urinary tract symptoms: Secondary | ICD-10-CM | POA: Diagnosis not present

## 2023-04-20 DIAGNOSIS — E785 Hyperlipidemia, unspecified: Secondary | ICD-10-CM | POA: Diagnosis not present

## 2023-04-20 DIAGNOSIS — E039 Hypothyroidism, unspecified: Secondary | ICD-10-CM | POA: Diagnosis not present

## 2023-04-20 DIAGNOSIS — K219 Gastro-esophageal reflux disease without esophagitis: Secondary | ICD-10-CM | POA: Diagnosis not present

## 2023-04-20 DIAGNOSIS — E1169 Type 2 diabetes mellitus with other specified complication: Secondary | ICD-10-CM | POA: Diagnosis not present

## 2023-04-20 DIAGNOSIS — I10 Essential (primary) hypertension: Secondary | ICD-10-CM | POA: Diagnosis not present

## 2023-04-20 DIAGNOSIS — G2581 Restless legs syndrome: Secondary | ICD-10-CM | POA: Diagnosis not present

## 2023-04-20 DIAGNOSIS — R3915 Urgency of urination: Secondary | ICD-10-CM | POA: Diagnosis not present

## 2023-10-25 DIAGNOSIS — R6882 Decreased libido: Secondary | ICD-10-CM | POA: Diagnosis not present

## 2023-10-25 DIAGNOSIS — E785 Hyperlipidemia, unspecified: Secondary | ICD-10-CM | POA: Diagnosis not present

## 2023-10-25 DIAGNOSIS — E1165 Type 2 diabetes mellitus with hyperglycemia: Secondary | ICD-10-CM | POA: Diagnosis not present

## 2023-10-25 DIAGNOSIS — E291 Testicular hypofunction: Secondary | ICD-10-CM | POA: Diagnosis not present

## 2023-10-25 DIAGNOSIS — E039 Hypothyroidism, unspecified: Secondary | ICD-10-CM | POA: Diagnosis not present

## 2023-10-25 DIAGNOSIS — Z0000 Encounter for general adult medical examination without abnormal findings: Secondary | ICD-10-CM | POA: Diagnosis not present

## 2023-10-28 DIAGNOSIS — E1169 Type 2 diabetes mellitus with other specified complication: Secondary | ICD-10-CM | POA: Diagnosis not present

## 2023-10-28 DIAGNOSIS — I10 Essential (primary) hypertension: Secondary | ICD-10-CM | POA: Diagnosis not present

## 2023-10-28 DIAGNOSIS — Z0000 Encounter for general adult medical examination without abnormal findings: Secondary | ICD-10-CM | POA: Diagnosis not present

## 2023-10-28 DIAGNOSIS — N401 Enlarged prostate with lower urinary tract symptoms: Secondary | ICD-10-CM | POA: Diagnosis not present

## 2023-10-28 DIAGNOSIS — E039 Hypothyroidism, unspecified: Secondary | ICD-10-CM | POA: Diagnosis not present

## 2023-10-28 DIAGNOSIS — L309 Dermatitis, unspecified: Secondary | ICD-10-CM | POA: Diagnosis not present

## 2023-10-28 DIAGNOSIS — K219 Gastro-esophageal reflux disease without esophagitis: Secondary | ICD-10-CM | POA: Diagnosis not present

## 2023-10-28 DIAGNOSIS — E785 Hyperlipidemia, unspecified: Secondary | ICD-10-CM | POA: Diagnosis not present

## 2024-01-05 DIAGNOSIS — K219 Gastro-esophageal reflux disease without esophagitis: Secondary | ICD-10-CM | POA: Diagnosis not present

## 2024-01-05 DIAGNOSIS — E1169 Type 2 diabetes mellitus with other specified complication: Secondary | ICD-10-CM | POA: Diagnosis not present

## 2024-01-05 DIAGNOSIS — I10 Essential (primary) hypertension: Secondary | ICD-10-CM | POA: Diagnosis not present

## 2024-01-05 DIAGNOSIS — E785 Hyperlipidemia, unspecified: Secondary | ICD-10-CM | POA: Diagnosis not present

## 2024-04-25 DIAGNOSIS — K219 Gastro-esophageal reflux disease without esophagitis: Secondary | ICD-10-CM | POA: Diagnosis not present

## 2024-04-25 DIAGNOSIS — E1165 Type 2 diabetes mellitus with hyperglycemia: Secondary | ICD-10-CM | POA: Diagnosis not present

## 2024-04-25 DIAGNOSIS — E1121 Type 2 diabetes mellitus with diabetic nephropathy: Secondary | ICD-10-CM | POA: Diagnosis not present

## 2024-04-25 DIAGNOSIS — E039 Hypothyroidism, unspecified: Secondary | ICD-10-CM | POA: Diagnosis not present

## 2024-04-25 DIAGNOSIS — R6882 Decreased libido: Secondary | ICD-10-CM | POA: Diagnosis not present

## 2024-04-25 DIAGNOSIS — N181 Chronic kidney disease, stage 1: Secondary | ICD-10-CM | POA: Diagnosis not present

## 2024-04-25 DIAGNOSIS — E785 Hyperlipidemia, unspecified: Secondary | ICD-10-CM | POA: Diagnosis not present

## 2024-04-25 DIAGNOSIS — E291 Testicular hypofunction: Secondary | ICD-10-CM | POA: Diagnosis not present

## 2024-04-30 DIAGNOSIS — L309 Dermatitis, unspecified: Secondary | ICD-10-CM | POA: Diagnosis not present

## 2024-04-30 DIAGNOSIS — E669 Obesity, unspecified: Secondary | ICD-10-CM | POA: Diagnosis not present

## 2024-04-30 DIAGNOSIS — E1169 Type 2 diabetes mellitus with other specified complication: Secondary | ICD-10-CM | POA: Diagnosis not present

## 2024-04-30 DIAGNOSIS — Z6833 Body mass index (BMI) 33.0-33.9, adult: Secondary | ICD-10-CM | POA: Diagnosis not present

## 2024-04-30 DIAGNOSIS — N401 Enlarged prostate with lower urinary tract symptoms: Secondary | ICD-10-CM | POA: Diagnosis not present

## 2024-04-30 DIAGNOSIS — E039 Hypothyroidism, unspecified: Secondary | ICD-10-CM | POA: Diagnosis not present

## 2024-04-30 DIAGNOSIS — E785 Hyperlipidemia, unspecified: Secondary | ICD-10-CM | POA: Diagnosis not present

## 2024-04-30 DIAGNOSIS — I10 Essential (primary) hypertension: Secondary | ICD-10-CM | POA: Diagnosis not present
